# Patient Record
Sex: Female | Born: 1980 | Race: Asian | Hispanic: No | Marital: Married | Smoking: Never smoker
Health system: Southern US, Community
[De-identification: ages and names within clinical notes are randomized; demographics above are authoritative.]

## PROBLEM LIST (undated history)

## (undated) DIAGNOSIS — G43909 Migraine, unspecified, not intractable, without status migrainosus: Secondary | ICD-10-CM

## (undated) DIAGNOSIS — N809 Endometriosis, unspecified: Secondary | ICD-10-CM

## (undated) DIAGNOSIS — K219 Gastro-esophageal reflux disease without esophagitis: Secondary | ICD-10-CM

## (undated) DIAGNOSIS — D494 Neoplasm of unspecified behavior of bladder: Secondary | ICD-10-CM

## (undated) DIAGNOSIS — R7303 Prediabetes: Secondary | ICD-10-CM

---

## 2007-06-12 ENCOUNTER — Encounter (INDEPENDENT_AMBULATORY_CARE_PROVIDER_SITE_OTHER): Payer: Self-pay | Admitting: Nurse Practitioner

## 2007-06-12 LAB — CONVERTED CEMR LAB
HCT: 36.1 %
Hemoglobin: 12.2 g/dL
Protein, ur: NEGATIVE mg/dL
Urine Glucose: NEGATIVE mg/dL

## 2007-06-14 ENCOUNTER — Encounter (INDEPENDENT_AMBULATORY_CARE_PROVIDER_SITE_OTHER): Payer: Self-pay | Admitting: Nurse Practitioner

## 2007-06-14 LAB — CONVERTED CEMR LAB
Chlamydia, DNA Probe: NEGATIVE
GC Probe Amp, Genital: NEGATIVE
RPR Ser Ql: NONREACTIVE

## 2007-07-27 ENCOUNTER — Encounter (INDEPENDENT_AMBULATORY_CARE_PROVIDER_SITE_OTHER): Payer: Self-pay | Admitting: Nurse Practitioner

## 2007-07-27 LAB — CONVERTED CEMR LAB
Hep B C IgM: NONREACTIVE
Hep B S Ab: NONREACTIVE
Hepatitis B Surface Ag: NONREACTIVE

## 2007-08-06 ENCOUNTER — Encounter (INDEPENDENT_AMBULATORY_CARE_PROVIDER_SITE_OTHER): Payer: Self-pay | Admitting: Nurse Practitioner

## 2007-08-06 LAB — CONVERTED CEMR LAB
BUN: 10 mg/dL
CO2: 25 meq/L
Calcium: 9.4 mg/dL
Chloride: 106 meq/L
Creatinine, Ser: 0.71 mg/dL
Glucose, Bld: 91 mg/dL
Potassium: 4.3 meq/L
Sodium: 139 meq/L

## 2007-08-09 ENCOUNTER — Ambulatory Visit: Payer: Self-pay | Admitting: Obstetrics and Gynecology

## 2007-11-03 ENCOUNTER — Ambulatory Visit: Payer: Self-pay | Admitting: Nurse Practitioner

## 2007-11-03 DIAGNOSIS — N926 Irregular menstruation, unspecified: Secondary | ICD-10-CM | POA: Insufficient documentation

## 2007-11-03 DIAGNOSIS — E663 Overweight: Secondary | ICD-10-CM | POA: Insufficient documentation

## 2007-11-03 LAB — CONVERTED CEMR LAB
ALT: 17 units/L (ref 0–35)
AST: 20 units/L (ref 0–37)
Albumin: 4.7 g/dL (ref 3.5–5.2)
Alkaline Phosphatase: 77 units/L (ref 39–117)
BUN: 10 mg/dL (ref 6–23)
Basophils Absolute: 0 10*3/uL (ref 0.0–0.1)
Basophils Relative: 0 % (ref 0–1)
CO2: 23 meq/L (ref 19–32)
Calcium: 9.3 mg/dL (ref 8.4–10.5)
Chloride: 106 meq/L (ref 96–112)
Creatinine, Ser: 0.72 mg/dL (ref 0.40–1.20)
Eosinophils Absolute: 0.1 10*3/uL (ref 0.0–0.7)
Eosinophils Relative: 1 % (ref 0–5)
Estradiol: 100.2 pg/mL
FSH: 5.3 milliintl units/mL
Glucose, Bld: 85 mg/dL (ref 70–99)
HCT: 38.4 % (ref 36.0–46.0)
Hemoglobin: 11.9 g/dL — ABNORMAL LOW (ref 12.0–15.0)
Insulin: 13 microintl units/mL (ref 3–28)
Lymphocytes Relative: 45 % (ref 12–46)
Lymphs Abs: 2.9 10*3/uL (ref 0.7–4.0)
MCHC: 31 g/dL (ref 30.0–36.0)
MCV: 86.5 fL (ref 78.0–100.0)
Monocytes Absolute: 0.4 10*3/uL (ref 0.1–1.0)
Monocytes Relative: 6 % (ref 3–12)
Neutro Abs: 3.1 10*3/uL (ref 1.7–7.7)
Neutrophils Relative %: 48 % (ref 43–77)
Platelets: 237 10*3/uL (ref 150–400)
Potassium: 4.6 meq/L (ref 3.5–5.3)
Preg, Serum: NEGATIVE
Prolactin: 4 ng/mL
RBC: 4.44 M/uL (ref 3.87–5.11)
RDW: 14.7 % (ref 11.5–15.5)
Sex Hormone Binding: 37 nmol/L (ref 18–114)
Sodium: 141 meq/L (ref 135–145)
TSH: 1.354 microintl units/mL (ref 0.350–5.50)
Testosterone Free: 6.6 pg/mL (ref 0.6–6.8)
Testosterone-% Free: 1.7 % (ref 0.4–2.4)
Testosterone: 39.44 ng/dL (ref 10–70)
Total Bilirubin: 1.1 mg/dL (ref 0.3–1.2)
Total Protein: 8 g/dL (ref 6.0–8.3)
WBC: 6.5 10*3/uL (ref 4.0–10.5)

## 2007-11-09 ENCOUNTER — Encounter (INDEPENDENT_AMBULATORY_CARE_PROVIDER_SITE_OTHER): Payer: Self-pay | Admitting: Nurse Practitioner

## 2007-11-14 ENCOUNTER — Encounter (INDEPENDENT_AMBULATORY_CARE_PROVIDER_SITE_OTHER): Payer: Self-pay | Admitting: Nurse Practitioner

## 2007-11-24 ENCOUNTER — Ambulatory Visit: Payer: Self-pay | Admitting: Nurse Practitioner

## 2007-11-24 DIAGNOSIS — R12 Heartburn: Secondary | ICD-10-CM

## 2007-11-24 DIAGNOSIS — N3000 Acute cystitis without hematuria: Secondary | ICD-10-CM | POA: Insufficient documentation

## 2007-11-24 LAB — CONVERTED CEMR LAB
Beta hcg, urine, semiquantitative: NEGATIVE
Bilirubin Urine: NEGATIVE
Blood in Urine, dipstick: NEGATIVE
Glucose, Urine, Semiquant: NEGATIVE
Ketones, urine, test strip: NEGATIVE
Nitrite: NEGATIVE
Protein, U semiquant: NEGATIVE
Specific Gravity, Urine: 1.02
Urobilinogen, UA: 0.2
pH: 5.5

## 2007-11-25 ENCOUNTER — Encounter (INDEPENDENT_AMBULATORY_CARE_PROVIDER_SITE_OTHER): Payer: Self-pay | Admitting: Nurse Practitioner

## 2007-11-27 ENCOUNTER — Emergency Department (HOSPITAL_COMMUNITY): Admission: EM | Admit: 2007-11-27 | Discharge: 2007-11-27 | Payer: Self-pay | Admitting: Emergency Medicine

## 2007-12-14 ENCOUNTER — Inpatient Hospital Stay (HOSPITAL_COMMUNITY): Admission: AD | Admit: 2007-12-14 | Discharge: 2007-12-14 | Payer: Self-pay | Admitting: Obstetrics & Gynecology

## 2008-01-09 ENCOUNTER — Inpatient Hospital Stay (HOSPITAL_COMMUNITY): Admission: AD | Admit: 2008-01-09 | Discharge: 2008-01-09 | Payer: Self-pay | Admitting: Obstetrics & Gynecology

## 2008-01-23 ENCOUNTER — Ambulatory Visit (HOSPITAL_COMMUNITY): Admission: RE | Admit: 2008-01-23 | Discharge: 2008-01-23 | Payer: Self-pay | Admitting: Family Medicine

## 2008-02-13 ENCOUNTER — Ambulatory Visit (HOSPITAL_COMMUNITY): Admission: RE | Admit: 2008-02-13 | Discharge: 2008-02-13 | Payer: Self-pay | Admitting: Family Medicine

## 2008-02-17 ENCOUNTER — Inpatient Hospital Stay (HOSPITAL_COMMUNITY): Admission: AD | Admit: 2008-02-17 | Discharge: 2008-02-17 | Payer: Self-pay | Admitting: Obstetrics & Gynecology

## 2008-03-11 ENCOUNTER — Ambulatory Visit (HOSPITAL_COMMUNITY): Admission: RE | Admit: 2008-03-11 | Discharge: 2008-03-11 | Payer: Self-pay | Admitting: Family Medicine

## 2008-04-22 ENCOUNTER — Inpatient Hospital Stay (HOSPITAL_COMMUNITY): Admission: AD | Admit: 2008-04-22 | Discharge: 2008-04-22 | Payer: Self-pay | Admitting: Obstetrics & Gynecology

## 2008-04-22 ENCOUNTER — Ambulatory Visit: Payer: Self-pay | Admitting: Obstetrics & Gynecology

## 2008-05-12 ENCOUNTER — Inpatient Hospital Stay (HOSPITAL_COMMUNITY): Admission: AD | Admit: 2008-05-12 | Discharge: 2008-05-12 | Payer: Self-pay | Admitting: Family Medicine

## 2008-05-12 ENCOUNTER — Ambulatory Visit: Payer: Self-pay | Admitting: Obstetrics and Gynecology

## 2008-05-20 ENCOUNTER — Ambulatory Visit (HOSPITAL_COMMUNITY): Admission: RE | Admit: 2008-05-20 | Discharge: 2008-05-20 | Payer: Self-pay | Admitting: Family Medicine

## 2008-06-11 ENCOUNTER — Ambulatory Visit (HOSPITAL_COMMUNITY): Admission: RE | Admit: 2008-06-11 | Discharge: 2008-06-11 | Payer: Self-pay | Admitting: Family Medicine

## 2008-07-31 ENCOUNTER — Ambulatory Visit: Payer: Self-pay | Admitting: Family Medicine

## 2008-07-31 ENCOUNTER — Inpatient Hospital Stay (HOSPITAL_COMMUNITY): Admission: AD | Admit: 2008-07-31 | Discharge: 2008-07-31 | Payer: Self-pay | Admitting: Obstetrics & Gynecology

## 2008-08-06 ENCOUNTER — Ambulatory Visit: Payer: Self-pay | Admitting: Obstetrics & Gynecology

## 2008-08-09 ENCOUNTER — Inpatient Hospital Stay (HOSPITAL_COMMUNITY): Admission: RE | Admit: 2008-08-09 | Discharge: 2008-08-13 | Payer: Self-pay | Admitting: Family Medicine

## 2008-08-09 ENCOUNTER — Ambulatory Visit: Payer: Self-pay | Admitting: Obstetrics and Gynecology

## 2008-08-10 ENCOUNTER — Encounter: Payer: Self-pay | Admitting: Obstetrics and Gynecology

## 2008-08-16 ENCOUNTER — Inpatient Hospital Stay (HOSPITAL_COMMUNITY): Admission: AD | Admit: 2008-08-16 | Discharge: 2008-08-21 | Payer: Self-pay | Admitting: Obstetrics & Gynecology

## 2008-08-24 ENCOUNTER — Inpatient Hospital Stay (HOSPITAL_COMMUNITY): Admission: AD | Admit: 2008-08-24 | Discharge: 2008-08-24 | Payer: Self-pay | Admitting: Obstetrics & Gynecology

## 2008-08-24 ENCOUNTER — Ambulatory Visit: Payer: Self-pay | Admitting: Advanced Practice Midwife

## 2008-08-28 ENCOUNTER — Ambulatory Visit: Payer: Self-pay | Admitting: Obstetrics and Gynecology

## 2008-09-12 ENCOUNTER — Ambulatory Visit: Payer: Self-pay | Admitting: Obstetrics & Gynecology

## 2009-11-02 IMAGING — CR DG CHEST 1V
1 series · 1 of 1 positions shown · non-contrast
Comparison: NONE

CLINICAL DATA: Positive PPD. 

CHEST - SINGLE VIEW (PA)

[view not recorded]
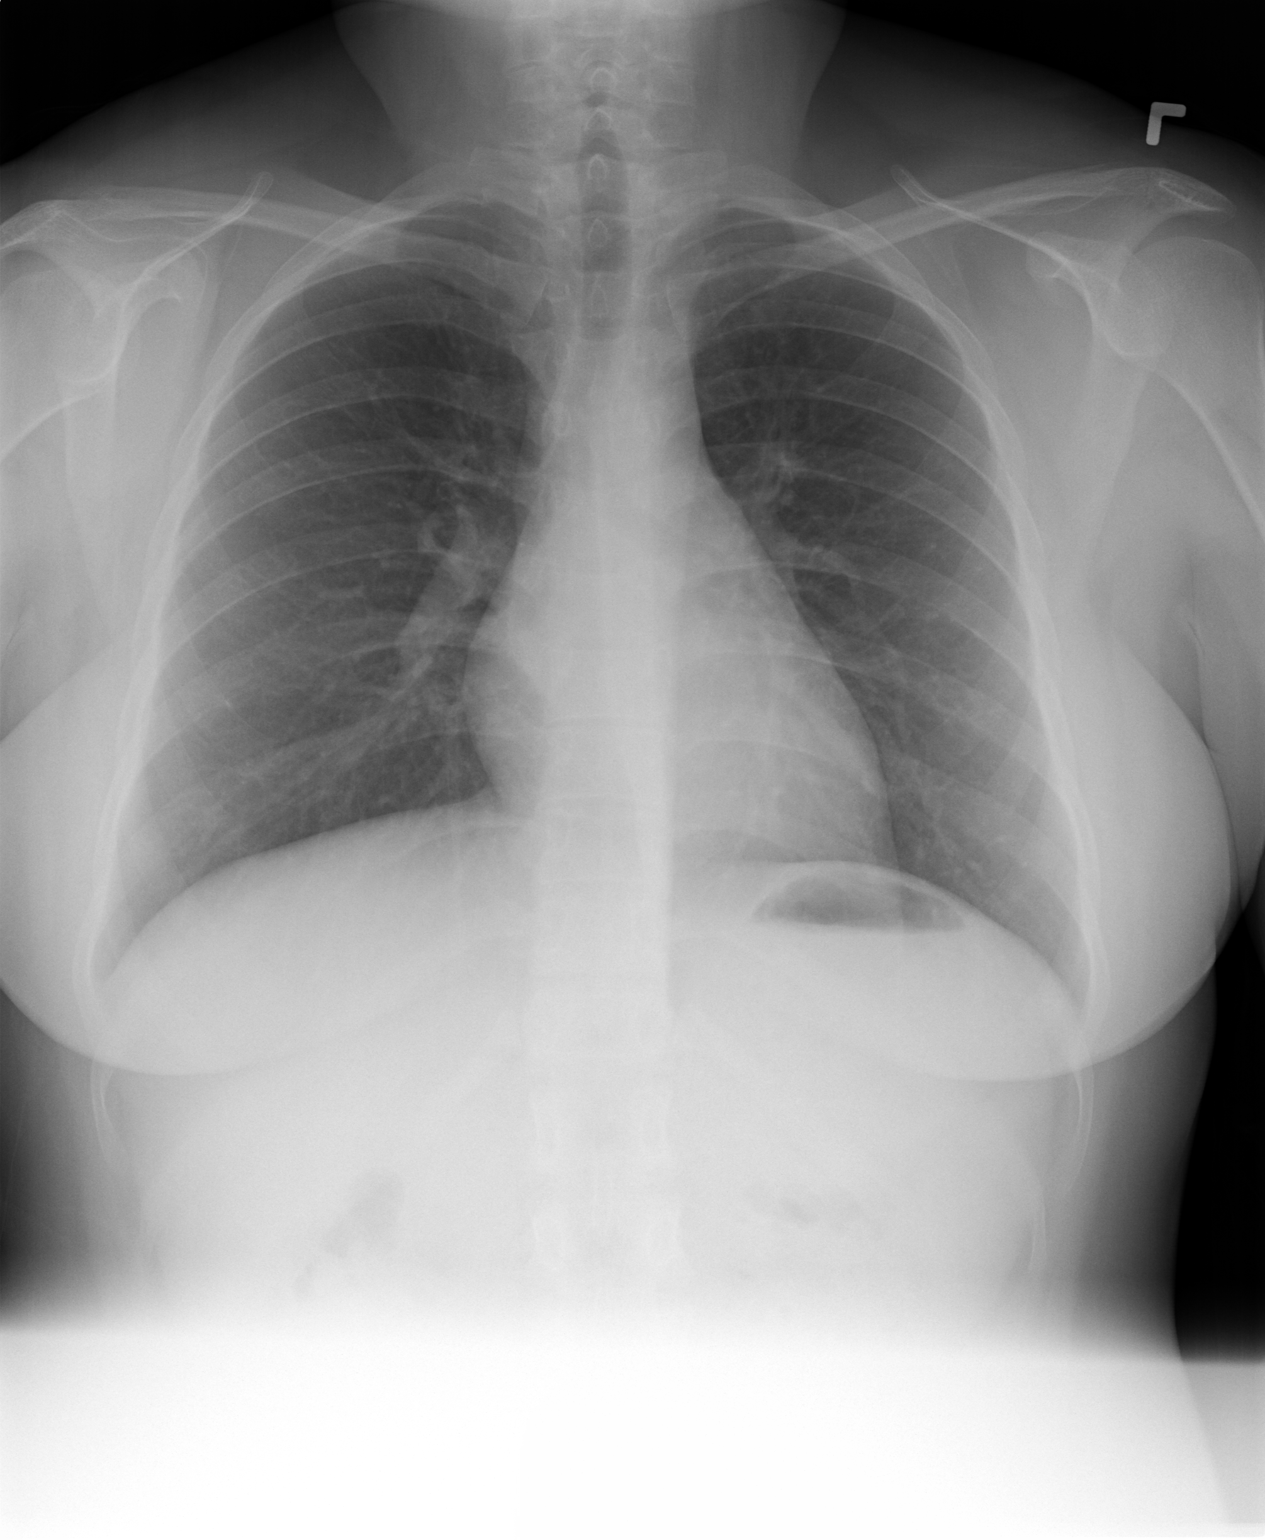

[1 of 1 positions shown; findings below may reference images not displayed]

FINDINGS: The lungs are clear and well expanded.   Heart and 
pulmonary vessels are normal.  No significant abnormalities are 
noted in the regional skeleton.  No evidence of thoracic aortic 
aneurysm or dissection.
IMPRESSION: No active disease. No evidence of active or remote 
09/19/2007 Dict Date: 09/19/2007  Trans Date: 09/19/2007 [REDACTED]  [REDACTED]

## 2010-04-02 IMAGING — CT CT ANGIO CHEST
4 series · 17 of 31 positions shown · IV contrast (agent unspecified)
Comparison: None.

CLINICAL DATA: 27-year-old female with 16 weeks pregnant with
shortness of breath and chest pain.

CT ANGIOGRAPHY CHEST
TECHNIQUE: Multidetector CT imaging of the chest using the
standard protocol during bolus administration of intravenous
contrast. Multiplanar reconstructed images including MIPs were
obtained and reviewed to evaluate the vascular anatomy.
Contrast: 115 ml Gmnipaque-YEE.

[Series 2: pe chest · axial · 0.57mm/px · z∈[-204,-67]mm · 5 of 93 slices shown]
[im 19/93  lung]
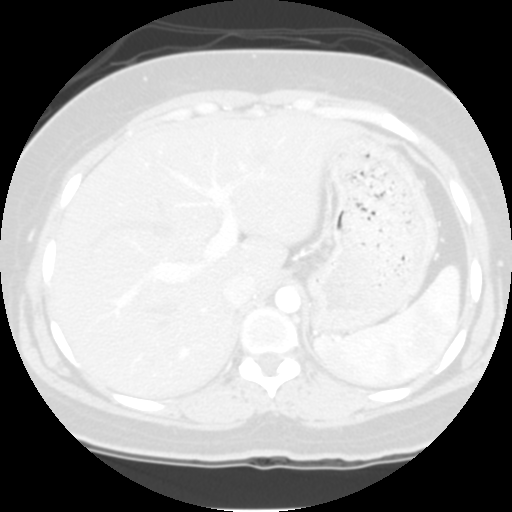
[im 37/93  mediastinal]
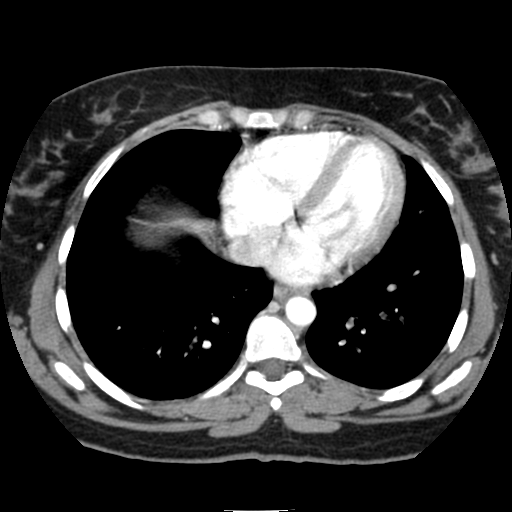
[im 56/93  lung]
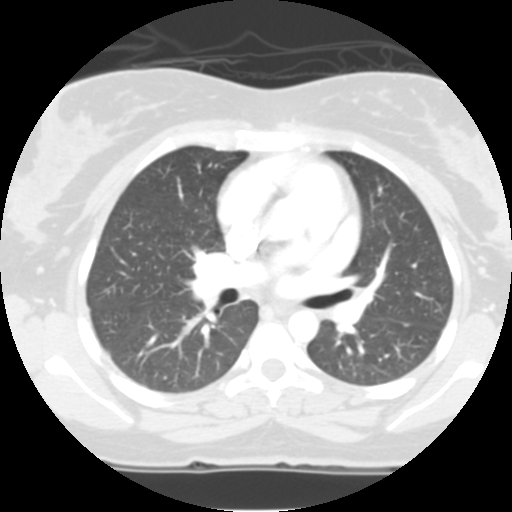
[im 63/93  mediastinal]
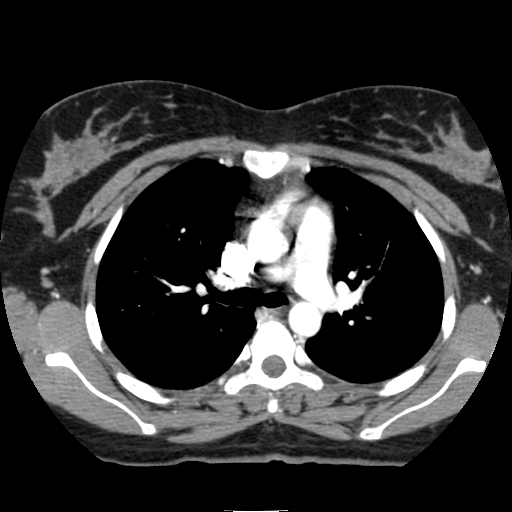
[im 74/93  lung]
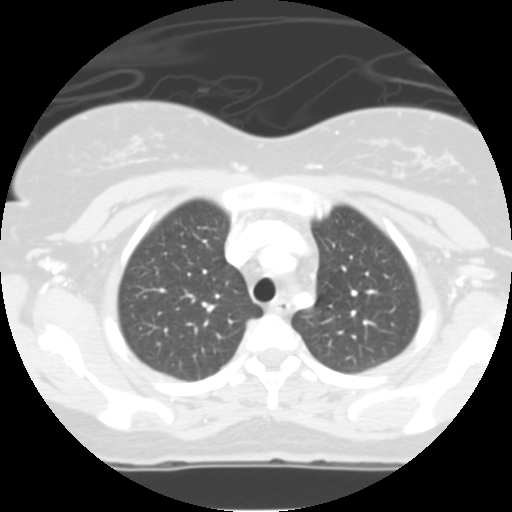

[Series 4: recon 3: pe chest · axial · 0.70mm/px · z∈[-233,-51]mm · 8 of 216 slices shown]
[im 17/216  lung]
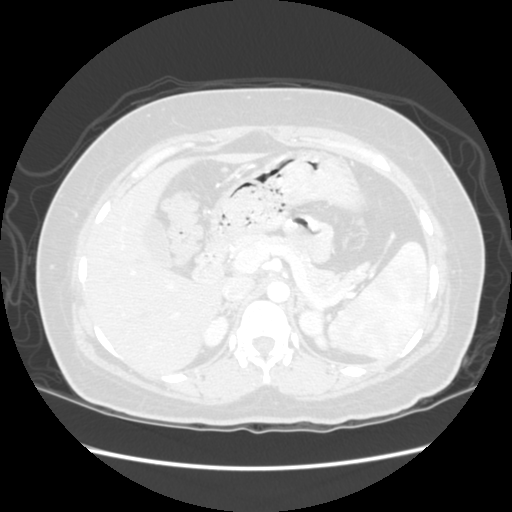
[im 50/216  lung]
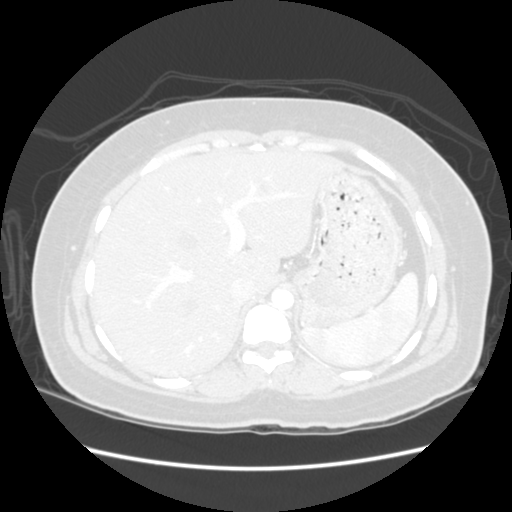
[im 83/216  lung]
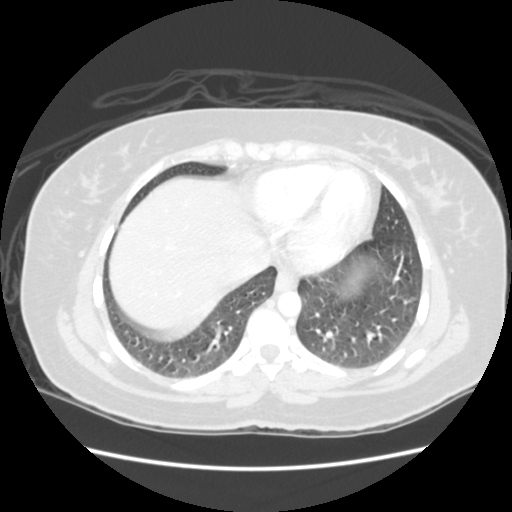
[im 108/216  lung]
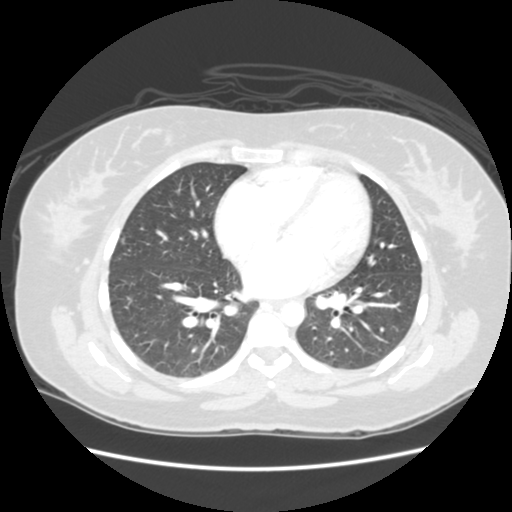
[im 116/216  lung]
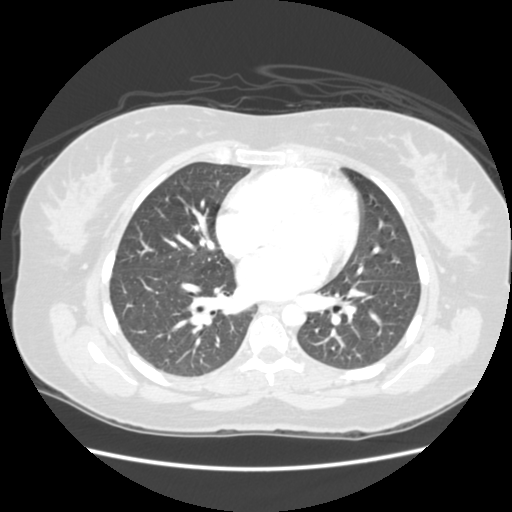
[im 149/216  lung]
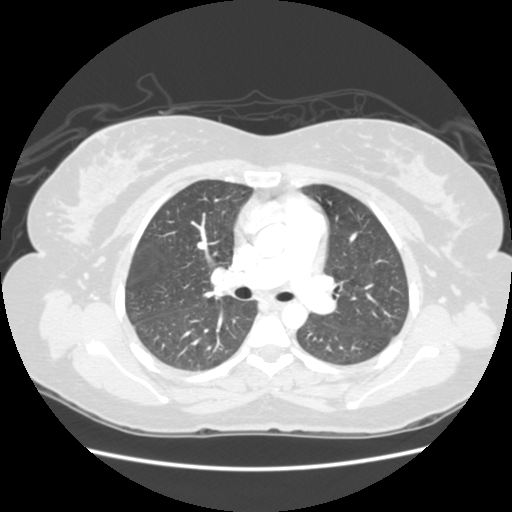
[im 166/216  lung]
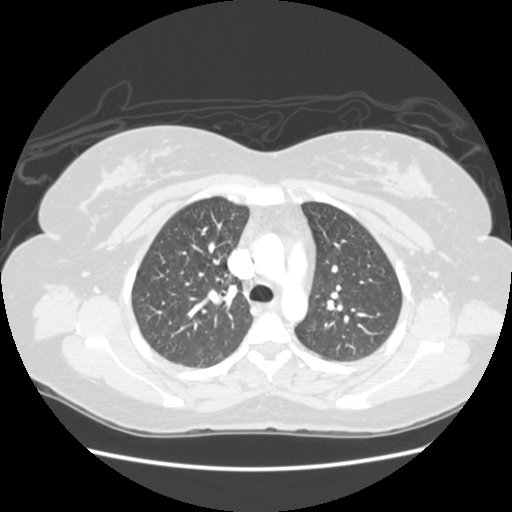
[im 199/216  lung]
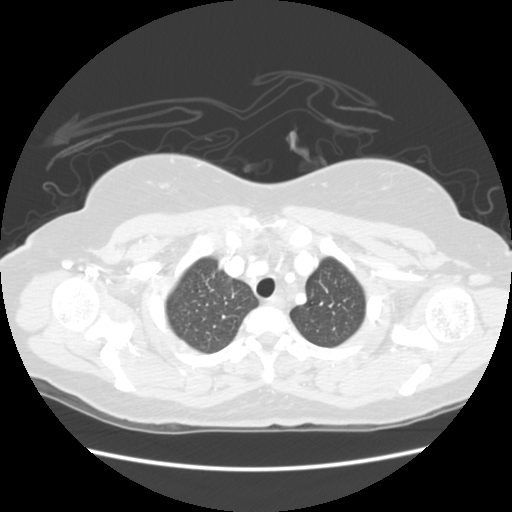

[Series 400: reformatted · coronal · 0.70mm/px · 2 of 66 slices shown (1 of 2)]
[im 22/66  lung]
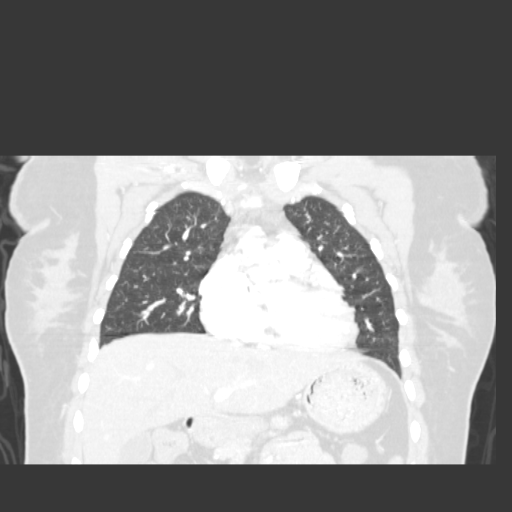
[im 44/66  lung]
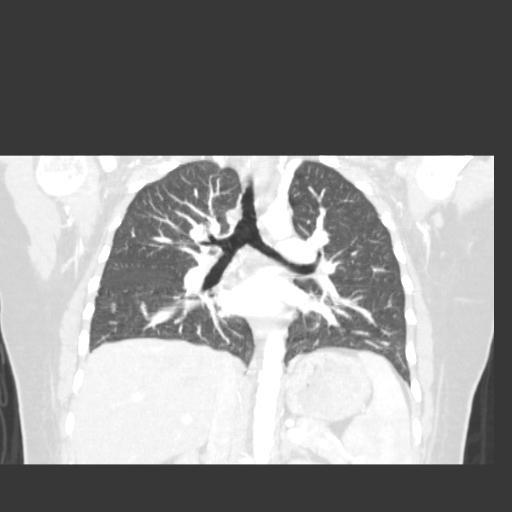

[Series 401: reformatted · sagittal · 0.70mm/px · 2 of 66 slices shown (2 of 2)]
[im 22/66  lung]
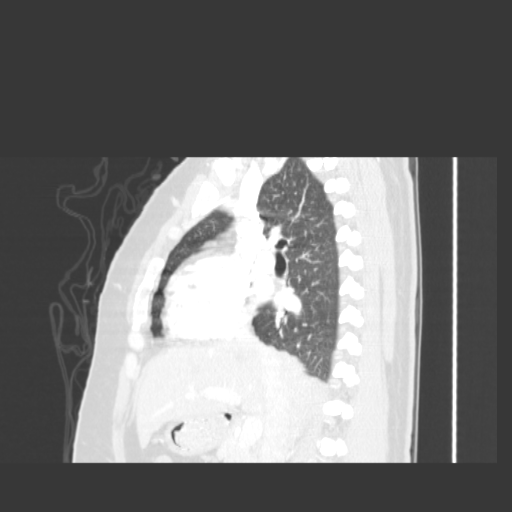
[im 44/66  lung]
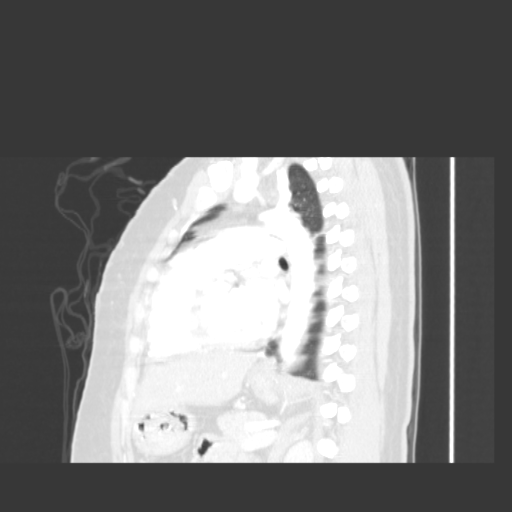

[17 of 31 positions shown; findings below may reference images not displayed]

FINDINGS: The patient's abdomen was shielded for the exam.

Good contrast bolus timing in the pulmonary arterial tree.  Normal
enhancement of the pulmonary arterial tree except for a level of
artifact involving both lower lobe segments at a level of focal
respiratory motion. No focal filling defect identified in the
pulmonary arterial tree to suggest the presence of acute pulmonary
embolism.

The major airways are patent.  Minor dependent atelectasis.  The
lungs are otherwise clear.

No acute osseous abnormality identified.

Visualized upper abdominal viscera are normal.  The visualized
aorta is normal.  No pericardial or pleural effusion.  The
mediastinum is normal with scant residual thymic tissue.  No
lymphadenopathy.  Normal thoracic inlet.
IMPRESSION: 1. No evidence of acute pulmonary embolus.
2. No acute cardiopulmonary abnormality.

## 2010-05-22 ENCOUNTER — Emergency Department (HOSPITAL_COMMUNITY)
Admission: EM | Admit: 2010-05-22 | Discharge: 2010-05-22 | Payer: Self-pay | Source: Home / Self Care | Admitting: Emergency Medicine

## 2010-05-25 LAB — DIFFERENTIAL
Basophils Absolute: 0 10*3/uL (ref 0.0–0.1)
Basophils Relative: 0 % (ref 0–1)
Eosinophils Absolute: 0 10*3/uL (ref 0.0–0.7)
Eosinophils Relative: 0 % (ref 0–5)
Lymphocytes Relative: 27 % (ref 12–46)
Lymphs Abs: 2.2 10*3/uL (ref 0.7–4.0)
Monocytes Absolute: 0.3 10*3/uL (ref 0.1–1.0)
Monocytes Relative: 4 % (ref 3–12)
Neutro Abs: 5.8 10*3/uL (ref 1.7–7.7)
Neutrophils Relative %: 69 % (ref 43–77)

## 2010-05-25 LAB — COMPREHENSIVE METABOLIC PANEL
ALT: 39 U/L — ABNORMAL HIGH (ref 0–35)
AST: 46 U/L — ABNORMAL HIGH (ref 0–37)
Albumin: 3.9 g/dL (ref 3.5–5.2)
Alkaline Phosphatase: 59 U/L (ref 39–117)
BUN: 11 mg/dL (ref 6–23)
CO2: 22 mEq/L (ref 19–32)
Calcium: 9.1 mg/dL (ref 8.4–10.5)
Chloride: 109 mEq/L (ref 96–112)
Creatinine, Ser: 0.58 mg/dL (ref 0.4–1.2)
GFR calc Af Amer: >60 mL/min (ref >60)
GFR calc non Af Amer: >60 mL/min (ref >60)
Glucose, Bld: 94 mg/dL (ref 70–99)
Potassium: 5 mEq/L (ref 3.5–5.1)
Sodium: 140 mEq/L (ref 135–145)
Total Bilirubin: 1.2 mg/dL (ref 0.3–1.2)
Total Protein: 6.9 g/dL (ref 6.0–8.3)

## 2010-05-25 LAB — POCT PREGNANCY, URINE: Preg Test, Ur: NEGATIVE

## 2010-05-25 LAB — CBC
Hemoglobin: 13.6 g/dL (ref 12.0–15.0)
MCH: 32 pg (ref 26.0–34.0)
MCHC: 34.2 g/dL (ref 30.0–36.0)
MCV: 93.6 fL (ref 78.0–100.0)
Platelets: 109 10*3/uL — ABNORMAL LOW (ref 150–400)
RBC: 4.25 MIL/uL (ref 3.87–5.11)
RDW: 13.1 % (ref 11.5–15.5)
WBC: 8.3 10*3/uL (ref 4.0–10.5)

## 2010-05-25 LAB — URINALYSIS, ROUTINE W REFLEX MICROSCOPIC
Bilirubin Urine: NEGATIVE
Nitrite: NEGATIVE
Protein, ur: NEGATIVE mg/dL
Specific Gravity, Urine: 1.016 (ref 1.005–1.030)
Urine Glucose, Fasting: NEGATIVE mg/dL
Urobilinogen, UA: 0.2 mg/dL (ref 0.0–1.0)
pH: 6 (ref 5.0–8.0)

## 2010-05-25 LAB — URINE MICROSCOPIC-ADD ON

## 2010-08-19 LAB — CBC
HCT: 28 % — ABNORMAL LOW (ref 36.0–46.0)
HCT: 30.7 % — ABNORMAL LOW (ref 36.0–46.0)
HCT: 36.7 % (ref 36.0–46.0)
Hemoglobin: 10.6 g/dL — ABNORMAL LOW (ref 12.0–15.0)
Hemoglobin: 12.7 g/dL (ref 12.0–15.0)
Hemoglobin: 8.2 g/dL — ABNORMAL LOW (ref 12.0–15.0)
Hemoglobin: 9.6 g/dL — ABNORMAL LOW (ref 12.0–15.0)
MCHC: 33.8 g/dL (ref 30.0–36.0)
MCHC: 34.4 g/dL (ref 30.0–36.0)
MCHC: 34.6 g/dL (ref 30.0–36.0)
MCHC: 34.6 g/dL (ref 30.0–36.0)
MCV: 95.2 fL (ref 78.0–100.0)
MCV: 95.4 fL (ref 78.0–100.0)
MCV: 95.7 fL (ref 78.0–100.0)
MCV: 96 fL (ref 78.0–100.0)
Platelets: 116 10*3/uL — ABNORMAL LOW (ref 150–400)
Platelets: 276 10*3/uL (ref 150–400)
Platelets: 297 10*3/uL (ref 150–400)
Platelets: 86 10*3/uL — ABNORMAL LOW (ref 150–400)
Platelets: 94 10*3/uL — ABNORMAL LOW (ref 150–400)
RBC: 2.94 MIL/uL — ABNORMAL LOW (ref 3.87–5.11)
RBC: 3.2 MIL/uL — ABNORMAL LOW (ref 3.87–5.11)
RBC: 3.85 MIL/uL — ABNORMAL LOW (ref 3.87–5.11)
RDW: 12.9 % (ref 11.5–15.5)
RDW: 12.9 % (ref 11.5–15.5)
RDW: 13.1 % (ref 11.5–15.5)
RDW: 13.2 % (ref 11.5–15.5)
RDW: 13.2 % (ref 11.5–15.5)
WBC: 14 10*3/uL — ABNORMAL HIGH (ref 4.0–10.5)
WBC: 15.9 10*3/uL — ABNORMAL HIGH (ref 4.0–10.5)
WBC: 8.4 10*3/uL (ref 4.0–10.5)
WBC: 8.6 10*3/uL (ref 4.0–10.5)
WBC: 8.8 10*3/uL (ref 4.0–10.5)

## 2010-08-19 LAB — COMPREHENSIVE METABOLIC PANEL
AST: 24 U/L (ref 0–37)
Albumin: 2 g/dL — ABNORMAL LOW (ref 3.5–5.2)
Alkaline Phosphatase: 103 U/L (ref 39–117)
Chloride: 109 mEq/L (ref 96–112)
GFR calc Af Amer: >60 mL/min (ref >60)
Potassium: 3.2 mEq/L — ABNORMAL LOW (ref 3.5–5.1)
Total Bilirubin: 0.7 mg/dL (ref 0.3–1.2)

## 2010-08-19 LAB — BASIC METABOLIC PANEL
BUN: 1 mg/dL — ABNORMAL LOW (ref 6–23)
Calcium: 7.9 mg/dL — ABNORMAL LOW (ref 8.4–10.5)
Creatinine, Ser: 0.55 mg/dL (ref 0.4–1.2)
GFR calc non Af Amer: >60 mL/min (ref >60)
Glucose, Bld: 89 mg/dL (ref 70–99)
Potassium: 3.3 mEq/L — ABNORMAL LOW (ref 3.5–5.1)

## 2010-08-19 LAB — DIFFERENTIAL
Basophils Absolute: 0 10*3/uL (ref 0.0–0.1)
Basophils Absolute: 0 10*3/uL (ref 0.0–0.1)
Basophils Relative: 0 % (ref 0–1)
Basophils Relative: 0 % (ref 0–1)
Eosinophils Absolute: 0.1 10*3/uL (ref 0.0–0.7)
Eosinophils Relative: 0 % (ref 0–5)
Eosinophils Relative: 1 % (ref 0–5)
Lymphocytes Relative: 13 % (ref 12–46)
Monocytes Absolute: 0.3 10*3/uL (ref 0.1–1.0)
Monocytes Absolute: 0.7 10*3/uL (ref 0.1–1.0)
Monocytes Relative: 8 % (ref 3–12)
Neutro Abs: 3.2 10*3/uL (ref 1.7–7.7)

## 2010-08-19 LAB — WOUND CULTURE: Gram Stain: NONE SEEN

## 2010-08-19 LAB — URINALYSIS, ROUTINE W REFLEX MICROSCOPIC
Bilirubin Urine: NEGATIVE
Hgb urine dipstick: NEGATIVE
Ketones, ur: NEGATIVE mg/dL
Nitrite: NEGATIVE
pH: 5.5 (ref 5.0–8.0)

## 2010-08-19 LAB — RPR: RPR Ser Ql: NONREACTIVE

## 2010-08-24 LAB — RAPID STREP SCREEN (MED CTR MEBANE ONLY): Streptococcus, Group A Screen (Direct): NEGATIVE

## 2010-09-22 NOTE — Group Therapy Note (Signed)
[name] [name], TROOST NO.:  [id number]   MEDICAL RECORD NO.:  [id number]          PATIENT TYPE:  WOC   LOCATION:  [facility]                   FACILITY:  [facility]   PHYSICIAN:  Deirdre Christy Gentles, CNM       DATE OF BIRTH:  02-23-1981   DATE OF SERVICE:                                  CLINIC NOTE   Patient is here for a followup on incision/wound check from August 10, 2008.  She was evaluated in the MAU for drainage from her C-section  incision and serosanguineous fluid.  She was also recently treated after  her C-section for endometritis on Augmentin and Bactrim for 14 days  which she has completed.  She currently notes no further drainage from  her incision or abdominal pain, cramping from the incision site.  She  denies any fevers, chills, nausea, vomiting, or sweats.  She does report  some constipation but is currently on Colace daily.  She also reports  that she is currently on her period.  She did receive Depo prior to her  discharge from her C-section.  Patient's C-section was originally on  August 10, 2008.  She was originally induced for postdates and received a  cesarean due to failed induction of labor secondary to cephalopelvic  disproportion, as well as non-reassuring fetal status.   PAST MEDICAL HISTORY:  Gastroesophageal reflux disease.   FAMILY HISTORY:  Diabetes.   SURGICAL HISTORY:  C-section with most recent pregnancy.   OB HISTORY:  She is a G1, P1 status post C-section August 10, 2008.   GYN HISTORY:  Last Pap smear was approximately October 2009.   ALLERGIES:  NO KNOWN DRUG ALLERGIES.   MEDICATIONS:  1. Prenatal vitamins.  2. Iron tablets.  3. Colace 100 mg daily.   PHYSICAL EXAM:  Temp is 97.1.  Pulse is 81.  Blood pressure is 125/82.  Weight is 149.9 pounds.  Height is 5 feet 6 inches.  Respiratory rate is  20.  GENERAL:  Well-nourished female in no acute distress.  HEENT:  Mucous membranes are moist.  NECK:  Supple.  There is no  thyromegaly.  LUNGS:  Clear to auscultation bilaterally.  CARDIOVASCULAR:  Regular rate and rhythm.  No murmurs.  ABDOMEN:  Soft, nontender, nondistended.  Well-healed C-section  incision.  There is no erythema or drainage around in the incision.  It  is clean, dry, and intact.  EXTREMITIES:  Two-plus peripheral pulses.  No clubbing or cyanosis.   ASSESSMENT:  G1, P1 status post C-section on August 10, 2008, complicated  by endometritis, status post antibiotic treatment, here for incision  check which appears to be well healed.   PLAN:  Continue Colace as needed for constipation.  Also, advised  increasing fiber in her diet, as well as water.  Continue routine wound  care.  Follow up for her 6-week postpartum check.  Patient also  requested to switch to OCPs.  Discussed that she is currently on Depo  and could wait until she is through with the first 3 months to discuss  switching to birth control.  She is also breastfeeding and  would  recommend progesterone-only pills if she still desires to be on birth  control pills at that time.     ______________________________  [name] [name]    ______________________________  [name] [name], CNM    CC/MEDQ  D:  09/12/2008  T:  09/12/2008  Job:  [id number]

## 2010-09-22 NOTE — Discharge Summary (Signed)
[name] [name], [name] [name]               ACCOUNT NO.:  [id number]   MEDICAL RECORD NO.:  [id number]           PATIENT TYPE:   LOCATION:                                 FACILITY:   PHYSICIAN:  Allie Bossier, MD        DATE OF BIRTH:  07-22-1980   DATE OF ADMISSION:  08/17/2008  DATE OF DISCHARGE:  08/21/2008                               DISCHARGE SUMMARY   ADMISSION DIAGNOSES:  Anemia, endometritis.   DISCHARGE DIAGNOSES:  Cellulitis of pannus and anemia.   CONDITION:  Stable.   DISPOSITION:  Home.   DIET:  As tolerated.   ACTIVITY:  As tolerated.   MEDICATIONS:  1. Over-the-counter iron pills 1 p.o. daily (325 mg).  2. Augmentin 875 mg 1 p.o. b.i.d. x14 days.  3. Bactrim DS 1 p.o. b.i.d. for 14 days.   FOLLOWUP:  In 1-2 weeks at the clinic or as necessary sooner.   PHYSICAL EXAM ON DISCHARGE:  HEENT:  Normal.  HEART:  Regular rate and rhythm.  LUNGS:  Clear to auscultation bilaterally.  ABDOMEN:  Benign, obese.  Uterus is firm and nontender at umbilicus -3.  Incision is clean, dry, and intact with Steri-Strips present.  No  evidence of wound infection.  Please note that she does have edema of  her panniculus with peu d'orange skin changes and much decreased  erythema.  Please note that she has been afebrile for almost 48 hours.   HISTORY OF PRESENT ILLNESS AND HOSPITAL COURSE:  [name] [name] was seen on  August 17, 2008 with a complaint of pain in her lower abdomen.  On exam  she had low-grade fevers in the 100.5 range and on admission her  hemoglobin was noted to be 8.2.  Her white count initially was 8.8 but  quickly came down to 4.3.  On admission her abdomen was tender.  She had  erythema and edema of her panniculus, and a culture that had previously  been done on her placenta from the time of her C-section on August 10, 2008 showed enterococcus.  She was started on IV Zosyn but continued to  spike low-grade temperatures and vancomycin was added on August 19, 2008.  By August 21, 2008 she had been afebrile for almost 48 hours and was tolerating p.o.  She was voiding without dysuria and she was ambulating.  She continued  to breast feed throughout her hospital course without difficulties and  she will be discharged home and follow up as above.      Allie Bossier, MD  Electronically Signed     MCD/MEDQ  D:  08/21/2008  T:  08/21/2008  Job:  [id number]

## 2010-09-22 NOTE — Op Note (Signed)
[name], [name]               ACCOUNT NO.:  [id number]   MEDICAL RECORD NO.:  [id number]          PATIENT TYPE:  INP   LOCATION:  9121                          [facility]ACILITY:  [facility]   PHYSICIAN:  Tilda Burrow, M.D. DATE OF BIRTH:  [birth year]   DATE OF PROCEDURE:  08/10/2008  DATE OF DISCHARGE:                               OPERATIVE REPORT   PREOPERATIVE DIAGNOSES:  1. Pregnancy at 30 weeks 1 day.  2. Induction of labor for post dates.  3. Failed induction of labor secondary to cephalopelvic disproportion.  4. Nonreassuring fetal status.   POSTOPERATIVE DIAGNOSES:  1. Pregnancy at 30 weeks 1 day.  2. Induction of labor for post dates.  3. Failed induction of labor secondary to cephalopelvic disproportion.  4. Nonreassuring fetal status.  5. Thick meconium, amniotic fluid.   PROCEDURE:  Primary low transverse cervical cesarean section.   SURGEON:  Tilda Burrow, MD   ASSISTANT:  None.   ANESTHESIA:  Epidural.   ANESTHESIOLOGIST:  Belva Agee, MD   COMPLICATIONS:  Extension of transverse uterine incision passed uterine  vessels on the patient's right side due to difficulty extracting the  vertex from its impacted position in the pelvic inlet.   INDICATIONS:  A 30 year old gravida 1, para 0 at 52 and 1, admitted on  April 2 for cervical ripening with Cervidil used, followed by Foley bulb  cervical ripening, the patient had Foley bulb placed at 7 p.m., removed  early on August 10, 2008, at 4 a.m. when cervix 5 cm.  The patient  progressed slowly through the morning, gradually reaching 9 cm complete,  -1 station, at 1345 with arrest of labor at that point with no progress  until 5 p.m.  Internal pressure catheter showed adequate contractions.  Cephalopelvic disproportion was diagnosed and with presenting part  nowhere near the ischial spines.  Cesarean section was recommended and  accepted.  During the brief time before proceeding to the OR, the  patient began to  develop significant deep repetitive decelerations  lasting up to 60 seconds per contraction.  Cesarean section was  converted to urgent and proceeded promptly to cesarean delivery.   DETAILS OF PROCEDURE:  The patient was taken to the operating room.  Epidural confirmed as being adequately anesthetizing the patient.  Brief  confirmation of case and the patient were performed and IV antibiotics  administered.  Fetal heart rate was documented in the room by  reattachment of the scalp electrode with fetal heart rate in the 160s.  Lower abdominal prepping and draping was performed.  Pfannenstiel-type  incision was performed with bladder quite high on the lower uterine  segment.  Vitals were tracked inferiorly with bladder flap developed,  transverse uterine incision performed at the level of the fetal ear, and  the quiet molded vertex rotated from its right occiput transverse  position into the incision.  Thick meconium was present behind the head.  Fetal head and body were easily expelled and cord clamped and the infant  passed to the pediatrics for care.  See pediatric's notes for details.  Cord blood samples  were obtained and cord blood gas obtained from the  arterial sample showing pH of 7.13.   The placenta was cultured on its fetal site and then removed intact with  membranes intact as well.  The uterus was irrigated with saline solution  and because of extension of the incision, we placed a self-retracting  ring in the vagina to hold the abdominal wall open.  The bowel was  packed with moistened laparotomy tape x1 on the right side.  A figure-of-  eight suture was placed to the left extension of the uterine incision  and held for traction due to bleeding of heavy venous nature from that  corner.  On the right side, the uterine vessels could be identified and  the extension of the incision went pass the uterine vessels.  The uterus  was closed in single layer running locking closure  from the right side  to midline and from the left side to the midline and then oversewn with  a second layer of running 0 chromic.  Irrigation was performed and  hemostasis confirmed as adequate.  Bladder flap was loosely  reapproximated with running 2-0 chromic.  Abdomen was irrigated with  saline solution and meconium identified in the abdomen.   Retractor ring was removed and peritoneum was closed with running 2-0  chromic.  The rectus muscles reapproximated x2 loosely approximating 2-0  Vicryl sutures and then the fascia closed with 0 Vicryl, continuous  running suture.  The subcu tissues were inspected, found to be  hemostatic, loosely reapproximated with 3 interrupted sutures of Vicryl  and then staple closure of the skin completed the procedure.  Sponge and  needle counts were correct.  Procedure was complicated by needle stick  to the operator's right index finger through double gloved access.  There was minimal blood encountered.  No contamination of the surgical  field.  The gloves were changed and the finger prepped with Betadine.  Review of the patient's record shows that she has a negative HIV and  negative RPR.      Tilda Burrow, M.D.  Electronically Signed     JVF/MEDQ  D:  08/10/2008  T:  08/11/2008  Job:  [id number]   cc:   Tilda Burrow, M.D.  Fax: [phone]

## 2010-09-22 NOTE — Discharge Summary (Signed)
[name] [name], [name] [name]               ACCOUNT NO.:  [id number]   MEDICAL RECORD NO.:  [id number]          PATIENT TYPE:  INP   LOCATION:  9121                          [facility]   PHYSICIAN:  Jamie Brookes, MD     DATE OF BIRTH:  01/03/1981   DATE OF ADMISSION:  08/09/2008  DATE OF DISCHARGE:  08/13/2008                               DISCHARGE SUMMARY   HOSPITAL COURSE:  This is a 30 year old female G1, P1 that delivered at  27 and one week.  She presented for induction of labor for postdating.  The patient was inducted with Cytotec.  The patient did begin to  progress and dilated enough for Foley bulb to be placed.  The patient  dilated further with a Foley bulb and was started on Pitocin.  The  patient was found to have a very narrow ischial spine and had made no  descent in the face of contractions and it was felt that the patient had  cephalopelvic disproportion and a C-section was recommended, explained  to the patient, and done by Dr. Emelda Fear.  Epidural anesthesia was used.  Fetal head and body was easily expelled.  There was a three-vessel cord  and the placenta was delivered via manual removal.  Estimated blood loss  was 800 mL.  Her Apgar scores at 1 and 5 were respectively 3 and 7.  The  mom is breast feeding.  She is going to get the Depo injection prior to  discharge.  She was GBS negative.  She was ABO B+, antibody negative,  hepatitis B surface antigen negative, rubella immune, HIV negative and  RPR nonreactive.  The labs on this patient are significant for a CBC on  August 09, 2008, hemoglobin of 12.7, hematocrit 36.7, and platelets of  116; however, over the next few days, platelets did decrease.  On August 11, 2008, the platelets were 86 and on August 12, 2008  platelets were 94,  they are headed in the right direction.  It was recommended that the  patient have a repeat CBC at the end of this week.  I will recommend  that she come back to the Maternity Admission Unit,  have a CBC drawn and  those results called to me.  Dr. Clotilde Dieter, pager number [phone].  The  patient will then [facility] up with [facility] at 6 weeks.  She  will have a [facility] nurse, and remove her staples on postop day 5-7.  The patient will go home with a prescription for some Percocet for pain  with prenatal vitamins.  It was noted that the patient was on  tuberculosis treatment therapy and has to restart INH when she [facility]  [facility] now that her pregnancy is over.  Again this is  [name], 30 year old, G1, P1 status post low-transverse C-section.  This was her primary C-section after a failed induction because of  cephalopelvic disproportion.  Please see dictation of C-section for  further procedure information.  The patient will be discharged to home  in stable medical condition.  Followup for this  patient will be the  appointment to the MAU on this Friday, which is August 16, 2008.  The  results to be called to Dr. Jamie Brookes at [phone].      Jamie Brookes, MD  Electronically Signed     AS/MEDQ  D:  08/13/2008  T:  08/14/2008  Job:  [id number]

## 2011-02-05 LAB — URINALYSIS, ROUTINE W REFLEX MICROSCOPIC
Bilirubin Urine: NEGATIVE
Glucose, UA: NEGATIVE
Hgb urine dipstick: NEGATIVE
Ketones, ur: 15 — AB
Nitrite: NEGATIVE
Protein, ur: NEGATIVE
Specific Gravity, Urine: 1.025
Urobilinogen, UA: 0.2
pH: 6

## 2011-02-05 LAB — CBC
HCT: 35.5 — ABNORMAL LOW
Hemoglobin: 12.2
MCHC: 34.4
MCV: 85.3
Platelets: 178
RBC: 4.16
RDW: 14.3
WBC: 8

## 2011-02-05 LAB — GC/CHLAMYDIA PROBE AMP, GENITAL
Chlamydia, DNA Probe: NEGATIVE
GC Probe Amp, Genital: NEGATIVE

## 2011-02-05 LAB — POCT URINALYSIS DIP (DEVICE)
Operator id: 247071
Protein, ur: NEGATIVE
Urobilinogen, UA: 1
pH: 5.5

## 2011-02-05 LAB — WET PREP, GENITAL
Clue Cells Wet Prep HPF POC: NONE SEEN
Trich, Wet Prep: NONE SEEN

## 2011-02-05 LAB — POCT PREGNANCY, URINE
Operator id: 247071
Preg Test, Ur: POSITIVE

## 2011-02-10 LAB — URINALYSIS, ROUTINE W REFLEX MICROSCOPIC
Nitrite: NEGATIVE
Protein, ur: NEGATIVE
Specific Gravity, Urine: 1.015
Urobilinogen, UA: 0.2

## 2011-02-10 LAB — COMPREHENSIVE METABOLIC PANEL
ALT: 16
AST: 19
Albumin: 3.6
Alkaline Phosphatase: 39
CO2: 23
Chloride: 106
Creatinine, Ser: 0.53
GFR calc Af Amer: >60
Potassium: 3.3 — ABNORMAL LOW
Sodium: 135
Total Bilirubin: 1.3 — ABNORMAL HIGH

## 2011-02-10 LAB — CBC
Platelets: 187
RBC: 4.05
WBC: 8.7

## 2011-07-01 ENCOUNTER — Other Ambulatory Visit: Payer: Self-pay | Admitting: Family Medicine

## 2011-07-01 DIAGNOSIS — G44009 Cluster headache syndrome, unspecified, not intractable: Secondary | ICD-10-CM

## 2011-07-01 DIAGNOSIS — Z8249 Family history of ischemic heart disease and other diseases of the circulatory system: Secondary | ICD-10-CM

## 2011-07-02 ENCOUNTER — Ambulatory Visit
Admission: RE | Admit: 2011-07-02 | Discharge: 2011-07-02 | Disposition: A | Discharge status: Home / Self Care | Payer: 59 | Source: Ambulatory Visit | Attending: Family Medicine | Admitting: Family Medicine

## 2011-07-02 DIAGNOSIS — G44009 Cluster headache syndrome, unspecified, not intractable: Secondary | ICD-10-CM

## 2011-07-02 DIAGNOSIS — Z8249 Family history of ischemic heart disease and other diseases of the circulatory system: Secondary | ICD-10-CM

## 2012-08-31 ENCOUNTER — Encounter (HOSPITAL_COMMUNITY): Payer: Self-pay | Admitting: Emergency Medicine

## 2012-08-31 ENCOUNTER — Emergency Department (HOSPITAL_COMMUNITY)
Admission: EM | Admit: 2012-08-31 | Discharge: 2012-09-01 | Disposition: A | Discharge status: Home / Self Care | Payer: 59 | Attending: Emergency Medicine | Admitting: Emergency Medicine

## 2012-08-31 DIAGNOSIS — N949 Unspecified condition associated with female genital organs and menstrual cycle: Secondary | ICD-10-CM | POA: Insufficient documentation

## 2012-08-31 DIAGNOSIS — E669 Obesity, unspecified: Secondary | ICD-10-CM | POA: Insufficient documentation

## 2012-08-31 DIAGNOSIS — N390 Urinary tract infection, site not specified: Secondary | ICD-10-CM

## 2012-08-31 DIAGNOSIS — R1084 Generalized abdominal pain: Secondary | ICD-10-CM | POA: Insufficient documentation

## 2012-08-31 DIAGNOSIS — R112 Nausea with vomiting, unspecified: Secondary | ICD-10-CM | POA: Insufficient documentation

## 2012-08-31 DIAGNOSIS — Z3202 Encounter for pregnancy test, result negative: Secondary | ICD-10-CM | POA: Insufficient documentation

## 2012-08-31 DIAGNOSIS — K6289 Other specified diseases of anus and rectum: Secondary | ICD-10-CM | POA: Insufficient documentation

## 2012-08-31 DIAGNOSIS — Z79899 Other long term (current) drug therapy: Secondary | ICD-10-CM | POA: Insufficient documentation

## 2012-08-31 DIAGNOSIS — Z8679 Personal history of other diseases of the circulatory system: Secondary | ICD-10-CM | POA: Insufficient documentation

## 2012-08-31 HISTORY — DX: Migraine, unspecified, not intractable, without status migrainosus: G43.909

## 2012-08-31 NOTE — ED Notes (Signed)
Patient with burning when she urinates which started at 5pm today.  Only small amounts when she urinates.  Pt is having body aches, nausea, and vomited x 1.

## 2012-09-01 LAB — URINE MICROSCOPIC-ADD ON

## 2012-09-01 LAB — WET PREP, GENITAL: Yeast Wet Prep HPF POC: NONE SEEN

## 2012-09-01 LAB — URINALYSIS, ROUTINE W REFLEX MICROSCOPIC
Bilirubin Urine: NEGATIVE
Glucose, UA: NEGATIVE mg/dL
Specific Gravity, Urine: 1.003 — ABNORMAL LOW (ref 1.005–1.030)
Urobilinogen, UA: 0.2 mg/dL (ref 0.0–1.0)

## 2012-09-01 LAB — PREGNANCY, URINE: Preg Test, Ur: NEGATIVE

## 2012-09-01 MED ORDER — ONDANSETRON HCL 8 MG PO TABS: 8.0000 mg | ORAL_TABLET | Freq: Three times a day (TID) | ORAL | Status: DC | PRN

## 2012-09-01 MED ORDER — PHENAZOPYRIDINE HCL 200 MG PO TABS: 200.0000 mg | ORAL_TABLET | Freq: Three times a day (TID) | ORAL | Status: DC

## 2012-09-01 MED ORDER — CEPHALEXIN 500 MG PO CAPS
500.0000 mg | ORAL_CAPSULE | Freq: Once | ORAL | Status: AC
  Administered 2012-09-01: 500 mg via ORAL
  Filled 2012-09-01: qty 1

## 2012-09-01 MED ORDER — HYDROCODONE-ACETAMINOPHEN 5-325 MG PO TABS: 1.0000 | ORAL_TABLET | ORAL | Status: DC | PRN

## 2012-09-01 MED ORDER — HYDROCODONE-ACETAMINOPHEN 5-325 MG PO TABS
2.0000 | ORAL_TABLET | Freq: Once | ORAL | Status: AC
  Administered 2012-09-01: 2 via ORAL
  Filled 2012-09-01: qty 2

## 2012-09-01 MED ORDER — CEPHALEXIN 500 MG PO CAPS: 1000.0000 mg | ORAL_CAPSULE | Freq: Two times a day (BID) | ORAL | Status: DC

## 2012-09-01 NOTE — ED Provider Notes (Signed)
History     CSN: [id number]  Arrival date & time 08/31/12  2300   First MD Initiated Contact with Patient 09/01/12 0201      Chief Complaint  Patient presents with  . Dysuria    (Consider location/radiation/quality/duration/timing/severity/associated sxs/prior treatment) HPI History provided by pt.   Pt has had severe dysuria since yesterday afternoon.  Associated w/ diffuse burning sensation of abdomen, pelvis, genitalia and rectum as well as N/V.  Denies fever, change in bowels and vaginal sx.  H/o UTI but otherwise healthy.  Past abd surgeries include c-section.  Past Medical History  Diagnosis Date  . Migraines     Past Surgical History  Procedure Laterality Date  . C-sections      History reviewed. No pertinent family history.  History  Substance Use Topics  . Smoking status: Never Smoker   . Smokeless tobacco: Not on file  . Alcohol Use: No    OB History   Grav Para Term Preterm Abortions TAB SAB Ect Mult Living                  Review of Systems  All other systems reviewed and are negative.    Allergies  Review of patient's allergies indicates no known allergies.  Home Medications   Current Outpatient Rx  Name  Route  Sig  Dispense  Refill  . atenolol (TENORMIN) 100 MG tablet   Oral   Take 100 mg by mouth daily.         . medroxyPROGESTERone (PROVERA) 5 MG tablet   Oral   Take 5 mg by mouth as directed. Twice a month. The 1st & the 7th.         . metFORMIN (GLUCOPHAGE) 500 MG tablet   Oral   Take 500 mg by mouth daily with breakfast.           BP 138/86  Pulse 70  Temp(Src) 97.5 F (36.4 C) (Oral)  Resp 20  Wt 170 lb (77.111 kg)  BMI 36.78 kg/m2  SpO2 100%  LMP 08/30/2012  Physical Exam  Nursing note and vitals reviewed. Constitutional: She is oriented to person, place, and time. She appears well-developed and well-nourished. No distress.  Uncomfortable appearing  HENT:  Head: Normocephalic and atraumatic.  Eyes:   Normal appearance  Neck: Normal range of motion.  Cardiovascular: Normal rate and regular rhythm.   Pulmonary/Chest: Effort normal and breath sounds normal. No respiratory distress.  Abdominal: Soft. Bowel sounds are normal. She exhibits no distension and no mass. There is no rebound and no guarding.  Obese, diffuse lower abdominal tenderness, particularly at mid-line.   Genitourinary:  No CVA tenderness.  Nml external genitalia.  No vaginal discharge/bleeding.  Cervix closed and appears nml.  No adnexal or cervical motion tenderess.    Musculoskeletal: Normal range of motion.  Neurological: She is alert and oriented to person, place, and time.  Skin: Skin is warm and dry. No rash noted.  Psychiatric: She has a normal mood and affect. Her behavior is normal.    ED Course  Procedures (including critical care time)  Labs Reviewed  WET PREP, GENITAL - Abnormal; Notable for the following:    WBC, Wet Prep HPF POC RARE (*)    All other components within normal limits  URINALYSIS, ROUTINE W REFLEX MICROSCOPIC - Abnormal; Notable for the following:    Specific Gravity, Urine 1.003 (*)    Hgb urine dipstick LARGE (*)    Leukocytes, UA LARGE (*)  All other components within normal limits  GC/CHLAMYDIA PROBE AMP  PREGNANCY, URINE  URINE MICROSCOPIC-ADD ON   No results found.   1. UTI (lower urinary tract infection)       MDM  32yo healthy F presents w/ dysuria and diffuse abd as well as genital and rectal burning since yesterday morning.  On exam, afebrile, uncomfortable appearing, abd soft/non-distended, diffuse lower abd ttp, worst at mid-line, unremarkable genitalia. U/A shows possible infection.  2 vicodin ordered for pain.  3:15 AM   Wet prep unremarkable.  Results discussed w/ pt.  Pain improved and pt appears more comfortable.  On re-examination, abd tenderness improved and is still worst over bladder.  Prescribed keflex, zofran, hydrocodone and pyridium. Return precautions  discussed.         Otilio Miu, PA-C 09/01/12 1478  Otilio Miu, PA-C 09/01/12 [phone]

## 2012-09-01 NOTE — ED Provider Notes (Signed)
Medical screening examination/treatment/procedure(s) were performed by non-physician practitioner and as supervising physician I was immediately available for consultation/collaboration.  Zuley Lutter, MD 09/01/12 2259 

## 2013-01-17 ENCOUNTER — Other Ambulatory Visit: Payer: Self-pay | Admitting: Urology

## 2013-01-19 ENCOUNTER — Encounter (HOSPITAL_BASED_OUTPATIENT_CLINIC_OR_DEPARTMENT_OTHER): Payer: Self-pay | Admitting: *Deleted

## 2013-01-19 NOTE — Progress Notes (Signed)
NPO AFTER MN. ARRIVES AT 0830. NEEDS HG AND URINE PREG. WILL TAKE ZANTAC AM OF SURG W/ SIP OF WATER.

## 2013-01-26 ENCOUNTER — Ambulatory Visit (HOSPITAL_BASED_OUTPATIENT_CLINIC_OR_DEPARTMENT_OTHER): Admission: RE | Admit: 2013-01-26 | Payer: 59 | Source: Ambulatory Visit | Admitting: Urology

## 2013-01-26 HISTORY — DX: Gastro-esophageal reflux disease without esophagitis: K21.9

## 2013-01-26 HISTORY — DX: Endometriosis, unspecified: N80.9

## 2013-01-26 HISTORY — DX: Prediabetes: R73.03

## 2013-01-26 HISTORY — DX: Neoplasm of unspecified behavior of bladder: D49.4

## 2013-01-26 SURGERY — CYSTOSCOPY, WITH BIOPSY
Anesthesia: General

## 2013-03-05 ENCOUNTER — Encounter (HOSPITAL_BASED_OUTPATIENT_CLINIC_OR_DEPARTMENT_OTHER): Payer: Self-pay | Admitting: *Deleted

## 2013-03-05 NOTE — Progress Notes (Addendum)
NPO AFTER MN. ARRIVE AT 0800. NEEDS ISTAT, EKG AND URINE PREG. WILL TAKE ZANTAC AM DOS W/ SIPS OF WATER.

## 2013-03-09 ENCOUNTER — Encounter (HOSPITAL_BASED_OUTPATIENT_CLINIC_OR_DEPARTMENT_OTHER): Payer: Self-pay | Admitting: *Deleted

## 2013-03-09 ENCOUNTER — Ambulatory Visit (HOSPITAL_BASED_OUTPATIENT_CLINIC_OR_DEPARTMENT_OTHER)
Admission: RE | Admit: 2013-03-09 | Discharge: 2013-03-09 | Disposition: A | Discharge status: Home / Self Care | Payer: 59 | Source: Ambulatory Visit | Attending: Urology | Admitting: Urology

## 2013-03-09 ENCOUNTER — Encounter (HOSPITAL_BASED_OUTPATIENT_CLINIC_OR_DEPARTMENT_OTHER): Payer: 59 | Admitting: Anesthesiology

## 2013-03-09 ENCOUNTER — Encounter (HOSPITAL_BASED_OUTPATIENT_CLINIC_OR_DEPARTMENT_OTHER)
Admission: RE | Disposition: A | Discharge status: Home / Self Care | Payer: Self-pay | Source: Ambulatory Visit | Attending: Urology

## 2013-03-09 ENCOUNTER — Ambulatory Visit (HOSPITAL_BASED_OUTPATIENT_CLINIC_OR_DEPARTMENT_OTHER): Payer: 59 | Admitting: Anesthesiology

## 2013-03-09 DIAGNOSIS — R3129 Other microscopic hematuria: Secondary | ICD-10-CM | POA: Insufficient documentation

## 2013-03-09 DIAGNOSIS — R9389 Abnormal findings on diagnostic imaging of other specified body structures: Secondary | ICD-10-CM | POA: Insufficient documentation

## 2013-03-09 DIAGNOSIS — R3915 Urgency of urination: Secondary | ICD-10-CM | POA: Insufficient documentation

## 2013-03-09 DIAGNOSIS — R82998 Other abnormal findings in urine: Secondary | ICD-10-CM | POA: Insufficient documentation

## 2013-03-09 DIAGNOSIS — G43909 Migraine, unspecified, not intractable, without status migrainosus: Secondary | ICD-10-CM | POA: Insufficient documentation

## 2013-03-09 DIAGNOSIS — K219 Gastro-esophageal reflux disease without esophagitis: Secondary | ICD-10-CM | POA: Insufficient documentation

## 2013-03-09 DIAGNOSIS — R350 Frequency of micturition: Secondary | ICD-10-CM | POA: Insufficient documentation

## 2013-03-09 HISTORY — PX: CYSTOSCOPY WITH BIOPSY: SHX5122

## 2013-03-09 LAB — URINALYSIS, ROUTINE W REFLEX MICROSCOPIC
Glucose, UA: NEGATIVE mg/dL
Ketones, ur: NEGATIVE mg/dL
Leukocytes, UA: NEGATIVE
Nitrite: NEGATIVE
Protein, ur: NEGATIVE mg/dL
Specific Gravity, Urine: 1.014 (ref 1.005–1.030)
Urobilinogen, UA: 0.2 mg/dL (ref 0.0–1.0)

## 2013-03-09 LAB — POCT I-STAT 4, (NA,K, GLUC, HGB,HCT)
HCT: 63 % — ABNORMAL HIGH (ref 36.0–46.0)
Sodium: 144 mEq/L (ref 135–145)

## 2013-03-09 SURGERY — CYSTOSCOPY, WITH BIOPSY
Anesthesia: General | Site: Bladder | Wound class: Clean Contaminated

## 2013-03-09 MED ORDER — DEXAMETHASONE SODIUM PHOSPHATE 4 MG/ML IJ SOLN
INTRAMUSCULAR | Status: DC | PRN
  Administered 2013-03-09: 10 mg via INTRAVENOUS

## 2013-03-09 MED ORDER — LACTATED RINGERS IV SOLN
INTRAVENOUS | Status: DC
  Administered 2013-03-09: 09:00:00 via INTRAVENOUS
  Filled 2013-03-09: qty 1000

## 2013-03-09 MED ORDER — CEFAZOLIN SODIUM 1-5 GM-% IV SOLN
1.0000 g | INTRAVENOUS | Status: AC
  Administered 2013-03-09: 2 g via INTRAVENOUS
  Filled 2013-03-09: qty 50

## 2013-03-09 MED ORDER — KETOROLAC TROMETHAMINE 30 MG/ML IJ SOLN
15.0000 mg | Freq: Once | INTRAMUSCULAR | Status: DC | PRN
  Filled 2013-03-09: qty 1

## 2013-03-09 MED ORDER — FENTANYL CITRATE 0.05 MG/ML IJ SOLN
25.0000 ug | INTRAMUSCULAR | Status: DC | PRN
  Filled 2013-03-09: qty 1

## 2013-03-09 MED ORDER — PROPOFOL 10 MG/ML IV BOLUS
INTRAVENOUS | Status: DC | PRN
  Administered 2013-03-09: 200 mg via INTRAVENOUS

## 2013-03-09 MED ORDER — MIDAZOLAM HCL 5 MG/5ML IJ SOLN
INTRAMUSCULAR | Status: DC | PRN
  Administered 2013-03-09: 1 mg via INTRAVENOUS

## 2013-03-09 MED ORDER — PROMETHAZINE HCL 25 MG/ML IJ SOLN
6.2500 mg | INTRAMUSCULAR | Status: DC | PRN
  Filled 2013-03-09: qty 1

## 2013-03-09 MED ORDER — LIDOCAINE HCL (CARDIAC) 20 MG/ML IV SOLN
INTRAVENOUS | Status: DC | PRN
  Administered 2013-03-09: 80 mg via INTRAVENOUS

## 2013-03-09 MED ORDER — FENTANYL CITRATE 0.05 MG/ML IJ SOLN
INTRAMUSCULAR | Status: DC | PRN
  Administered 2013-03-09: 50 ug via INTRAVENOUS

## 2013-03-09 MED ORDER — PHENAZOPYRIDINE HCL 200 MG PO TABS
200.0000 mg | ORAL_TABLET | Freq: Once | ORAL | Status: AC
  Administered 2013-03-09: 200 mg via ORAL
  Filled 2013-03-09: qty 1

## 2013-03-09 MED ORDER — STERILE WATER FOR IRRIGATION IR SOLN
Status: DC | PRN
  Administered 2013-03-09: 3000 mL

## 2013-03-09 MED ORDER — ONDANSETRON HCL 4 MG/2ML IJ SOLN
INTRAMUSCULAR | Status: DC | PRN
  Administered 2013-03-09: 4 mg via INTRAVENOUS

## 2013-03-09 MED ORDER — PHENAZOPYRIDINE HCL 200 MG PO TABS: 200.0000 mg | ORAL_TABLET | Freq: Three times a day (TID) | ORAL | Status: DC | PRN

## 2013-03-09 SURGICAL SUPPLY — 13 items
BAG DRAIN URO-CYSTO SKYTR STRL (DRAIN) ×2 IMPLANT
CANISTER SUCT LVC 12 LTR MEDI- (MISCELLANEOUS) ×2 IMPLANT
CLOTH BEACON ORANGE TIMEOUT ST (SAFETY) ×2 IMPLANT
DRAPE CAMERA CLOSED 9X96 (DRAPES) ×2 IMPLANT
ELECT REM PT RETURN 9FT ADLT (ELECTROSURGICAL) ×2
ELECTRODE REM PT RTRN 9FT ADLT (ELECTROSURGICAL) ×1 IMPLANT
GLOVE BIO SURGEON STRL SZ7.5 (GLOVE) ×2 IMPLANT
GLOVE INDICATOR 6.5 STRL GRN (GLOVE) ×4 IMPLANT
GOWN STRL REIN XL XLG (GOWN DISPOSABLE) ×4 IMPLANT
NEEDLE HYPO 22GX1.5 SAFETY (NEEDLE) IMPLANT
NS IRRIG 500ML POUR BTL (IV SOLUTION) IMPLANT
PACK CYSTOSCOPY (CUSTOM PROCEDURE TRAY) ×2 IMPLANT
WATER STERILE IRR 3000ML UROMA (IV SOLUTION) ×2 IMPLANT

## 2013-03-09 NOTE — H&P (Signed)
Urology Admission H&P   History of Present Illness: Pt presents for exam under anesthesia, cystoscopy and bladder biopsy in the evaluation of lower urinary tract symptoms and abnormal urinalysis. 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   Patient has been well without fever or dysuria.   Past Medical History  Diagnosis Date  . Migraines   . Bladder neoplasm   . Prediabetes   . GERD (gastroesophageal reflux disease)   . Endometriosis    Past Surgical History  Procedure Laterality Date  . Cesarean section  08-10-2008    Home Medications:  Prescriptions prior to admission  Medication Sig Dispense Refill  . atenolol (TENORMIN) 100 MG tablet Take 100 mg by mouth every evening.       . metFORMIN (GLUCOPHAGE) 500 MG tablet Take 500 mg by mouth daily with breakfast.      . ranitidine (ZANTAC) 150 MG tablet Take 150 mg by mouth as needed for heartburn.      . SUMAtriptan (IMITREX) 20 MG/ACT nasal spray Place 1 spray into the nose every 2 (two) hours as needed for migraine.      Marland Kitchen HYDROcodone-acetaminophen (NORCO/VICODIN) 5-325 MG per tablet Take 1 tablet by mouth every 4 (four) hours as needed for pain.  20 tablet  0  . medroxyPROGESTERone (PROVERA) 10 MG tablet Take 10 mg by mouth as directed. TAKES EVERY 28TH DAY OF THE MONTH FOR ONE WEEK      . ondansetron (ZOFRAN) 8 MG tablet Take 1 tablet (8 mg total) by mouth every 8 (eight) hours as needed for nausea.  20 tablet  0   Allergies: No Known Allergies  History reviewed. No pertinent family history. Social History:  reports that she has never smoked. She has never used smokeless tobacco. She reports that she does not drink alcohol or use illicit drugs.  Review of Systems  All other systems reviewed and are negative.    Physical Exam:  Vital signs in last 24 hours: Temp:  [97.5 F (36.4 C)] 97.5 F (36.4 C) (10/31 0821) Pulse Rate:  [53] 53 (10/31 0821) Resp:  [16] 16 (10/31 0821) BP: (119)/(78) 119/78  mmHg (10/31 0821) SpO2:  [99 %] 99 % (10/31 0821) Weight:  [78.019 kg (172 lb)] 78.019 kg (172 lb) (10/31 1610) Physical Exam NAD A&Ox3 CV - RRR Lungs - reg effort and depth Abd - soft, NT Ext - no CCE   Laboratory Data:  Results for orders placed during the hospital encounter of 03/09/13 (from the past 24 hour(s))  POCT PREGNANCY, URINE     Status: None   Collection Time    03/09/13  8:22 AM      Result Value Range   Preg Test, Ur NEGATIVE  NEGATIVE   No results found for this or any previous visit (from the past 240 hour(s)). Creatinine: No results found for this basename: CREATININE,  in the last 168 hours Baseline Creatinine:   UA today clear.   Impression/Assessment:  Urinary frequency, urgency Pyuria Microhematuria  Plan:  I discussed with the patient the nature, potential benefits, risks and alternatives to cystoscopy, bladder biopsy and fulguration, including side effects of the proposed treatment, the likelihood of the patient achieving the goals of the procedure, and any potential problems that might occur during the procedure or recuperation. All questions answered. Patient elects to proceed.    Antony Haste 03/09/2013, 9:01 AM

## 2013-03-09 NOTE — Op Note (Signed)
Preop diagnosis: Urinary frequency and urgency, pyuria, microscopic hematuria Postoperative diagnosis: Same  Procedure: Exam under anesthesia Cystoscopy bladder biopsy and fulguration  Surgeon: Mena Goes Anesthesia: Rose Type of anesthesia: Gen.  Findings: On exam under anesthesia the bladder was palpably normal, the urethra was palpably normal and there were no abdominal masses on bimanual exam. The on digital rectal exam the anus and rectum were palpably normal and in the posterior vaginal wall palpably normal without mass on bimanual exam.  Cystoscopy revealed a normal urethra. The trigone had some normal appearing increased vascularity and squamous metaplasia. There was excellent clear efflux from both ureteral orifices. The mucosa along the right ureteral orifice was somewhat heaped up as can be seen in a low-grade neoplasm and a representative portion was biopsied and fulgurated superior and lateral to the right UO. Post-biopsy and fulguration there was excellent efflux from the right ureteral orifice. Bladder capacity greater than 550 cc. I stopped bladder filling at this volume and did not distended and capacity.  Description of procedure: After consent was obtained patient brought to the operating room. After adequate anesthesia she was placed in lithotomy position and prepped and draped in the usual sterile fashion. A timeout was performed to confirm the patient and procedure. An exam under anesthesia was performed. Gloves were changed and a cystoscope passed per urethra. The bladder was examined with a 12 and 70 lens. The only abnormality noted was changes in the mucosa around the right ureteral orifice. The mucosa here was more heaped up and bumpy in appearance compared to the remainder the trigone and the left ureteral orifice which showed smooth mucosa. A safe distance from the right UO was biopsied and fulgurated.  Hemostasis was excellent at low-pressure. Good efflux was seen from  the right ureteral orifice. The bladder was drained and the scope removed. She was awakened and taken to the recovery room in stable condition.  Complications: None Drains: None Blood Loss: Minimal  Specimens: Right bladder biopsy to pathology  Disposition: Patient stable to PACU

## 2013-03-09 NOTE — Anesthesia Preprocedure Evaluation (Signed)
Anesthesia Evaluation  Patient identified by MRN, date of birth, ID band Patient awake    Reviewed: Allergy & Precautions, H&P , NPO status , Patient's Chart, lab work & pertinent test results  Airway Mallampati: II TM Distance: >3 FB Neck ROM: Full    Dental no notable dental hx.    Pulmonary neg pulmonary ROS,  breath sounds clear to auscultation  Pulmonary exam normal       Cardiovascular negative cardio ROS  Rhythm:Regular Rate:Normal     Neuro/Psych negative neurological ROS  negative psych ROS   GI/Hepatic Neg liver ROS, GERD-  Medicated,  Endo/Other  negative endocrine ROS  Renal/GU negative Renal ROS  negative genitourinary   Musculoskeletal negative musculoskeletal ROS (+)   Abdominal   Peds negative pediatric ROS (+)  Hematology negative hematology ROS (+)   Anesthesia Other Findings   Reproductive/Obstetrics negative OB ROS                           Anesthesia Physical Anesthesia Plan  ASA: II  Anesthesia Plan: General   Post-op Pain Management:    Induction: Intravenous  Airway Management Planned: LMA  Additional Equipment:   Intra-op Plan:   Post-operative Plan:   Informed Consent: I have reviewed the patients History and Physical, chart, labs and discussed the procedure including the risks, benefits and alternatives for the proposed anesthesia with the patient or authorized representative who has indicated his/her understanding and acceptance.   Dental advisory given  Plan Discussed with: CRNA and Surgeon  Anesthesia Plan Comments:         Anesthesia Quick Evaluation  

## 2013-03-09 NOTE — Transfer of Care (Signed)
Immediate Anesthesia Transfer of Care Note  Patient: [name] [name]  Procedure(s) Performed: Procedure(s): CYSTOSCOPY WITH BLADDER BIOPSY/FULGARATION (N/A)  Patient Location: PACU  Anesthesia Type:General  Level of Consciousness: awake and oriented  Airway & Oxygen Therapy: Patient Spontanous Breathing and Patient connected to nasal cannula oxygen  Post-op Assessment: Report given to PACU RN  Post vital signs: Reviewed and stable  Complications: No apparent anesthesia complications

## 2013-03-09 NOTE — Anesthesia Procedure Notes (Signed)
Procedure Name: LMA Insertion Date/Time: 03/09/2013 9:33 AM Performed by: Maris Berger T Pre-anesthesia Checklist: Patient identified, Emergency Drugs available, Suction available and Patient being monitored Patient Re-evaluated:Patient Re-evaluated prior to inductionOxygen Delivery Method: Circle System Utilized Preoxygenation: Pre-oxygenation with 100% oxygen Intubation Type: IV induction Ventilation: Mask ventilation without difficulty LMA: LMA inserted LMA Size: 4.0 Number of attempts: 1 Airway Equipment and Method: bite block Placement Confirmation: positive ETCO2 Dental Injury: Teeth and Oropharynx as per pre-operative assessment

## 2013-03-09 NOTE — Anesthesia Postprocedure Evaluation (Signed)
  Anesthesia Post-op Note  Patient: [name] [name]  Procedure(s) Performed: Procedure(s) (LRB): CYSTOSCOPY WITH BLADDER BIOPSY/FULGARATION (N/A)  Patient Location: PACU  Anesthesia Type: General  Level of Consciousness: awake and alert   Airway and Oxygen Therapy: Patient Spontanous Breathing  Post-op Pain: mild  Post-op Assessment: Post-op Vital signs reviewed, Patient's Cardiovascular Status Stable, Respiratory Function Stable, Patent Airway and No signs of Nausea or vomiting  Last Vitals:  Filed Vitals:   03/09/13 1005  BP:   Pulse:   Temp: 36.4 C  Resp:     Post-op Vital Signs: stable   Complications: No apparent anesthesia complications

## 2013-03-12 ENCOUNTER — Encounter (HOSPITAL_BASED_OUTPATIENT_CLINIC_OR_DEPARTMENT_OTHER): Payer: Self-pay | Admitting: Urology

## 2013-03-18 ENCOUNTER — Emergency Department (HOSPITAL_COMMUNITY)
Admission: EM | Admit: 2013-03-18 | Discharge: 2013-03-18 | Disposition: A | Discharge status: Home / Self Care | Payer: 59 | Source: Home / Self Care | Attending: Family Medicine | Admitting: Family Medicine

## 2013-03-18 ENCOUNTER — Encounter (HOSPITAL_COMMUNITY): Payer: Self-pay | Admitting: Emergency Medicine

## 2013-03-18 DIAGNOSIS — J020 Streptococcal pharyngitis: Secondary | ICD-10-CM

## 2013-03-18 LAB — POCT RAPID STREP A: Streptococcus, Group A Screen (Direct): POSITIVE — AB

## 2013-03-18 MED ORDER — CEFDINIR 300 MG PO CAPS: 300.0000 mg | ORAL_CAPSULE | Freq: Two times a day (BID) | ORAL | Status: DC

## 2013-03-18 NOTE — ED Notes (Signed)
32 yr old c/o Fever, sore throat and migraine x 3 dys. She state  the back of neck hurts radiating down right arm. She has used Tylenol OTC PRN, Imitrex  and it did not help.

## 2013-03-18 NOTE — ED Provider Notes (Signed)
CSN: [id number]     Arrival date & time 03/18/13  1121 History   First MD Initiated Contact with Patient 03/18/13 1125     Chief Complaint  Patient presents with  . Fever  . Sore Throat  . Migraine   (Consider location/radiation/quality/duration/timing/severity/associated sxs/prior Treatment) Patient is a 32 y.o. female presenting with pharyngitis. The history is provided by the patient and the spouse.  Sore Throat This is a new problem. The current episode started more than 2 days ago. The problem has been gradually worsening. Associated symptoms include headaches. Pertinent negatives include no chest pain and no abdominal pain. The symptoms are aggravated by swallowing.    Past Medical History  Diagnosis Date  . Migraines   . Bladder neoplasm   . Prediabetes   . GERD (gastroesophageal reflux disease)   . Endometriosis    Past Surgical History  Procedure Laterality Date  . Cesarean section  08-10-2008  . Cystoscopy with biopsy N/A 03/09/2013    Procedure: CYSTOSCOPY WITH BLADDER BIOPSY/FULGARATION;  Surgeon: Antony Haste, MD;  Location: [facility];  Service: Urology;  Laterality: N/A;   No family history on file. History  Substance Use Topics  . Smoking status: Never Smoker   . Smokeless tobacco: Never Used  . Alcohol Use: No   OB History   Grav Para Term Preterm Abortions TAB SAB Ect Mult Living                 Review of Systems  Constitutional: Positive for fever, chills and appetite change.  HENT: Positive for sore throat. Negative for rhinorrhea.   Respiratory: Negative for cough.   Cardiovascular: Negative for chest pain.  Gastrointestinal: Negative.  Negative for abdominal pain.  Genitourinary: Negative.   Neurological: Positive for headaches.    Allergies  Review of patient's allergies indicates no known allergies.  Home Medications   Current Outpatient Rx  Name  Route  Sig  Dispense  Refill  . atenolol (TENORMIN) 100 MG  tablet   Oral   Take 100 mg by mouth every evening.          . cefdinir (OMNICEF) 300 MG capsule   Oral   Take 1 capsule (300 mg total) by mouth 2 (two) times daily.   20 capsule   0   . HYDROcodone-acetaminophen (NORCO/VICODIN) 5-325 MG per tablet   Oral   Take 1 tablet by mouth every 4 (four) hours as needed for pain.   20 tablet   0   . medroxyPROGESTERone (PROVERA) 10 MG tablet   Oral   Take 10 mg by mouth as directed. TAKES EVERY 28TH DAY OF THE MONTH FOR ONE WEEK         . metFORMIN (GLUCOPHAGE) 500 MG tablet   Oral   Take 500 mg by mouth daily with breakfast.         . ondansetron (ZOFRAN) 8 MG tablet   Oral   Take 1 tablet (8 mg total) by mouth every 8 (eight) hours as needed for nausea.   20 tablet   0   . phenazopyridine (PYRIDIUM) 200 MG tablet   Oral   Take 1 tablet (200 mg total) by mouth 3 (three) times daily as needed for pain.   10 tablet   0   . ranitidine (ZANTAC) 150 MG tablet   Oral   Take 150 mg by mouth as needed for heartburn.         . SUMAtriptan (IMITREX) 20 MG/ACT  nasal spray   Nasal   Place 1 spray into the nose every 2 (two) hours as needed for migraine.          BP 124/83  Pulse 91  Temp(Src) 101.2 F (38.4 C) (Oral)  Resp 20  SpO2 95%  LMP 01/22/2013 Physical Exam  Nursing note and vitals reviewed. Constitutional: She is oriented to person, place, and time. She appears well-developed and well-nourished.  HENT:  Right Ear: External ear normal.  Left Ear: External ear normal.  Mouth/Throat: Uvula is midline and mucous membranes are normal. Oropharyngeal exudate and posterior oropharyngeal erythema present.  Eyes: Conjunctivae are normal. Pupils are equal, round, and reactive to light.  Neck: Normal range of motion. Neck supple.  Pulmonary/Chest: Breath sounds normal.  Lymphadenopathy:    She has cervical adenopathy.  Neurological: She is alert and oriented to person, place, and time.  Skin: Skin is warm and dry.     ED Course  Procedures (including critical care time) Labs Review Labs Reviewed  POCT RAPID STREP A ([facility]RG CARE ONLY) - Abnormal; Notable for the following:    Streptococcus, Group A Screen (Direct) POSITIVE (*)    All other components within normal limits   Imaging Review No results found.  EKG Interpretation     Ventricular Rate:    PR Interval:    QRS Duration:   QT Interval:    QTC Calculation:   R Axis:     Text Interpretation:              MDM  Strep pos.    Linna Hoff, MD 03/18/13 [phone]

## 2013-03-23 DIAGNOSIS — Z0289 Encounter for other administrative examinations: Secondary | ICD-10-CM

## 2013-04-10 ENCOUNTER — Telehealth: Payer: Self-pay

## 2013-04-10 NOTE — Telephone Encounter (Signed)
Patient's husband called regarding form status. I let him know I tried to call [facility] to get a new copy of the form (p2) as I made an error on this form. I have not heard back from them. If they can get that to me then I can get it complete immediately. Also, we will only be able to note that patient may not be able to work when having migraines. We can not address frequency as patient did not have a f/u appointment for [place] to determine that. Patient is scheduled for February OV.

## 2013-04-13 ENCOUNTER — Telehealth: Payer: Self-pay | Admitting: Diagnostic Neuroimaging

## 2013-04-13 NOTE — Telephone Encounter (Signed)
Patient's husband calling to check on the status of the forms and why they were blank. Patient's husband is concerned, informed patient that we are waiting on hearing back from Dr. Marjory Lies and that we will call him back as soon as we hear back.

## 2013-04-13 NOTE — Telephone Encounter (Signed)
Please advise 

## 2013-04-16 NOTE — Telephone Encounter (Signed)
Husband came in with new forms. I completed them. They were signed by Dr. Marjory Lies and returned to patient's husband. He has been advised that they will need new forms when patient comes in for February 2015 OV, due to not being seen since January 2014. Husband understands.

## 2013-04-16 NOTE — Telephone Encounter (Signed)
I returned patient's husband's call. Forms that were delivered her for me were returned to him by unknown source without being seen of completed by me. I have asked the husband to bring the forms in, we will complete them and I will have Dr. Marjory Lies sign them and I will return them directly to patient's husband.

## 2013-06-25 ENCOUNTER — Telehealth: Payer: Self-pay | Admitting: Diagnostic Neuroimaging

## 2013-06-25 NOTE — Telephone Encounter (Signed)
Pt has an appt on 06-27-13 however her husband is out of town and will not be able to bring her.  He would be able to bring her in anytime after the 19th. Pt has had a constant migraine for the past 3+ days.  She is having an extremely hard time with light and noise and the medicine does not seem to be working.  Please call Durga back at [phone] and advise if we would be able to get her into the office before Dr. Gladstone Lighter first available appointment in May.  Thank you.

## 2013-06-27 ENCOUNTER — Ambulatory Visit: Payer: Self-pay | Admitting: Diagnostic Neuroimaging

## 2013-07-06 ENCOUNTER — Ambulatory Visit: Payer: Self-pay | Admitting: Nurse Practitioner

## 2013-07-11 ENCOUNTER — Ambulatory Visit (INDEPENDENT_AMBULATORY_CARE_PROVIDER_SITE_OTHER): Payer: 59 | Admitting: Diagnostic Neuroimaging

## 2013-07-11 ENCOUNTER — Encounter: Payer: Self-pay | Admitting: Diagnostic Neuroimaging

## 2013-07-11 ENCOUNTER — Encounter (INDEPENDENT_AMBULATORY_CARE_PROVIDER_SITE_OTHER): Payer: Self-pay

## 2013-07-11 VITALS — BP 109/75 | HR 52 | Temp 98.1°F | Ht 59.0 in | Wt 172.0 lb

## 2013-07-11 DIAGNOSIS — G43009 Migraine without aura, not intractable, without status migrainosus: Secondary | ICD-10-CM

## 2013-07-11 MED ORDER — TOPIRAMATE 50 MG PO TABS: 50.0000 mg | ORAL_TABLET | Freq: Two times a day (BID) | ORAL | Status: DC

## 2013-07-11 NOTE — Progress Notes (Signed)
[facility]  PATIENT: [name] [name] DOB: Dec 05, 1980  REFERRING CLINICIAN: Rankins HISTORY FROM: patient and husband REASON FOR VISIT: follow up   HISTORICAL  CHIEF COMPLAINT:  Chief Complaint  Patient presents with  . Follow-up    migraine    HISTORY OF PRESENT ILLNESS:   UPDATE 07/11/13: Since last visit, tried amitriptyline, but stopped because of daytime sedation. Now having 8-10 migraine days per month. Also having right shoulder pain/numbness, with migraines.  PRIOR HPI (05/29/12): 33 year old right-handed female here for evaluation of migraine.  Past 5-6 years, patient has intermittent right-sided headaches, with nausea, photophobia, phonophobia. She typically has 2 of these headaches per week, sometimes lasting 10-12 hours at a time. She started on Tegretol 6-12 months ago without relief. She started on amitriptyline 1 month ago with mild relief. She's also on sumatriptan spray with mild relief.  Patient also reports neck pain, right arm numbness, right body pain, usually associated with the headaches. Sometimes she has right arm numbness when she wakes up in the morning but is not headache associated.  One week ago patient was bending over cleaning at home, when she had sudden onset low back pain on the right side, radiating to her right leg. She's currently on prednisone and cyclobenzaprine. She has missed 2 days of work because of this pain.  REVIEW OF SYSTEMS: Full 14 system review of systems performed and notable only for headache.  ALLERGIES: No Known Allergies  HOME MEDICATIONS: Outpatient Prescriptions Prior to Visit  Medication Sig Dispense Refill  . atenolol (TENORMIN) 100 MG tablet Take 100 mg by mouth every evening.       . ranitidine (ZANTAC) 150 MG tablet Take 150 mg by mouth as needed for heartburn.      . SUMAtriptan (IMITREX) 20 MG/ACT nasal spray Place 1 spray into the nose every 2 (two) hours as needed for migraine.      .  cefdinir (OMNICEF) 300 MG capsule Take 1 capsule (300 mg total) by mouth 2 (two) times daily.  20 capsule  0  . HYDROcodone-acetaminophen (NORCO/VICODIN) 5-325 MG per tablet Take 1 tablet by mouth every 4 (four) hours as needed for pain.  20 tablet  0  . medroxyPROGESTERone (PROVERA) 10 MG tablet Take 10 mg by mouth as directed. TAKES EVERY 28TH DAY OF THE MONTH FOR ONE WEEK      . metFORMIN (GLUCOPHAGE) 500 MG tablet Take 500 mg by mouth daily with breakfast.      . ondansetron (ZOFRAN) 8 MG tablet Take 1 tablet (8 mg total) by mouth every 8 (eight) hours as needed for nausea.  20 tablet  0  . phenazopyridine (PYRIDIUM) 200 MG tablet Take 1 tablet (200 mg total) by mouth 3 (three) times daily as needed for pain.  10 tablet  0   No facility-administered medications prior to visit.    PAST MEDICAL HISTORY: Past Medical History  Diagnosis Date  . Migraines   . Bladder neoplasm   . Prediabetes   . GERD (gastroesophageal reflux disease)   . Endometriosis     PAST SURGICAL HISTORY: Past Surgical History  Procedure Laterality Date  . Cesarean section  08-10-2008  . Cystoscopy with biopsy N/A 03/09/2013    Procedure: CYSTOSCOPY WITH BLADDER BIOPSY/FULGARATION;  Surgeon: Fredricka Bonine, MD;  Location: [facility];  Service: Urology;  Laterality: N/A;    FAMILY HISTORY: No family history on file.  SOCIAL HISTORY:  History   Social History  . Marital Status:  Married    Spouse Name: Mabell Esguerra    Number of Children: 1  . Years of Education: HS   Occupational History  .  [employer]   Social History Main Topics  . Smoking status: Never Smoker   . Smokeless tobacco: Never Used  . Alcohol Use: No  . Drug Use: No  . Sexual Activity: Not on file   Other Topics Concern  . Not on file   Social History Narrative   Patient lives at home with her family.   Caffeine use: none     PHYSICAL EXAM  Filed Vitals:   07/11/13 1402  BP: 109/75   Pulse: 52  Temp: 98.1 F (36.7 C)  TempSrc: Oral  Height: 4' 11"  (1.499 m)  Weight: 172 lb (78.019 kg)    Not recorded    Body mass index is 34.72 kg/(m^2).  GENERAL EXAM: Patient is in no distress; well developed, nourished and groomed; neck is supple  CARDIOVASCULAR: Regular rate and rhythm, no murmurs, no carotid bruits  NEUROLOGIC: MENTAL STATUS: awake, alert, oriented to person, place and time, recent and remote memory intact, normal attention and concentration, language fluent, comprehension intact, naming intact, fund of knowledge appropriate;  (CAN SPEAK AND UNDERSTAND SIMPLE ENGLISH; [place]). CRANIAL NERVE: no papilledema on fundoscopic exam, pupils equal and reactive to light, visual fields full to confrontation, extraocular muscles intact, no nystagmus, facial sensation and strength symmetric, hearing intact, palate elevates symmetrically, uvula midline, shoulder shrug symmetric, tongue midline. MOTOR: normal bulk and tone, full strength in the BUE, BLE SENSORY: normal and symmetric to light touch, pinprick, temperature, vibration COORDINATION: finger-nose-finger, fine finger movements normal REFLEXES: deep tendon reflexes present and symmetric GAIT/STATION: narrow based gait; romberg is negative    DIAGNOSTIC DATA (LABS, IMAGING, TESTING) - I reviewed patient records, labs, notes, testing and imaging myself where available.  Lab Results  Component Value Date   WBC 8.3 05/22/2010   HGB 21.4* 03/09/2013   HCT 63.0* 03/09/2013   MCV 93.6 05/22/2010   PLT 109* 05/22/2010      Component Value Date/Time   NA 144 03/09/2013 0953   K 4.9 03/09/2013 0953   CL 109 05/22/2010 2145   CO2 22 05/22/2010 2145   GLUCOSE 98 03/09/2013 0953   BUN 11 05/22/2010 2145   CREATININE 0.58 05/22/2010 2145   CALCIUM 9.1 05/22/2010 2145   PROT 6.9 05/22/2010 2145   ALBUMIN 3.9 05/22/2010 2145   AST 46* 05/22/2010 2145   ALT 39* 05/22/2010 2145    ALKPHOS 59 05/22/2010 2145   BILITOT 1.2 05/22/2010 2145   GFRNONAA >60 05/22/2010 2145   GFRAA  Value: >60        The eGFR has been calculated using the MDRD equation. This calculation has not been validated in all clinical situations. eGFR's persistently <60 mL/min signify possible Chronic Kidney Disease. 05/22/2010 2145   No results found for this basename: CHOL, HDL, LDLCALC, LDLDIRECT, TRIG, CHOLHDL   No results found for this basename: HGBA1C   No results found for this basename: VITAMINB12   Lab Results  Component Value Date   TSH 1.354 11/03/2007    07/03/11 MRI brain - normal  07/03/11 MRA head - normal   ASSESSMENT AND PLAN  33 y.o. female with intermittent right-sided headaches, sometimes associated with pain and numbness in her right arm and right leg. Most likely represent migraine headaches. Sometimes she has intermittent numbness/pain in her right shoulder, sometimes  headache associated. This could represent migraine phenomenon, cervical radiculopathy, neuropathy, or musculoskeletal causes.   Dx: migraine without aura  PLAN: 1. Start topiramate  2. Continue sumatriptan spray 3. Consider botox in future (currently having 8-10 migraines per month, doesn't qualify for botox)   Meds ordered this encounter  Medications  . topiramate (TOPAMAX) 50 MG tablet    Sig: Take 1 tablet (50 mg total) by mouth 2 (two) times daily.    Dispense:  60 tablet    Refill:  12    Return in about 4 months (around 11/10/2013).    Penni Bombard, MD 11/16/1503, 6:97 PM Certified in Neurology, Neurophysiology and Neuroimaging  [facility] [address] [place], [place] [address] [phone]

## 2013-07-11 NOTE — Patient Instructions (Signed)
Try topiramate 50mg  at bedtime for 2 weeks, then increase to twice a day.  Drink plenty of water.  Try to exercise 30 minutes every day.

## 2013-11-14 ENCOUNTER — Ambulatory Visit: Payer: 59 | Admitting: Diagnostic Neuroimaging

## 2013-11-26 ENCOUNTER — Telehealth: Payer: Self-pay | Admitting: *Deleted

## 2013-11-26 MED ORDER — TOPIRAMATE 50 MG PO TABS: 50.0000 mg | ORAL_TABLET | Freq: Two times a day (BID) | ORAL | Status: AC

## 2013-11-26 NOTE — Telephone Encounter (Signed)
We need mail order info to send Rx.  I called back.  Mr Cange indicates the Rx needs to go to [facility].  He says the local pharmacy is going to call ins to request an override to fill meds locally 1 more time while mail order transition is in process.  Rx has been sent to mail order.
